# Patient Record
Sex: Female | Born: 1980 | Race: White | Marital: Married | Smoking: Current every day smoker
Health system: Southern US, Community
[De-identification: ages and names within clinical notes are randomized; demographics above are authoritative.]

## PROBLEM LIST (undated history)

## (undated) DIAGNOSIS — I10 Essential (primary) hypertension: Secondary | ICD-10-CM

## (undated) HISTORY — PX: TUBAL LIGATION: SHX77

---

## 2017-07-06 ENCOUNTER — Encounter: Payer: Self-pay | Admitting: Emergency Medicine

## 2017-07-06 ENCOUNTER — Emergency Department
Admission: EM | Admit: 2017-07-06 | Discharge: 2017-07-06 | Disposition: A | Discharge status: Home / Self Care | Payer: 59 | Attending: Emergency Medicine | Admitting: Emergency Medicine

## 2017-07-06 DIAGNOSIS — R030 Elevated blood-pressure reading, without diagnosis of hypertension: Secondary | ICD-10-CM

## 2017-07-06 DIAGNOSIS — R21 Rash and other nonspecific skin eruption: Secondary | ICD-10-CM | POA: Diagnosis present

## 2017-07-06 DIAGNOSIS — F17210 Nicotine dependence, cigarettes, uncomplicated: Secondary | ICD-10-CM | POA: Insufficient documentation

## 2017-07-06 DIAGNOSIS — I10 Essential (primary) hypertension: Secondary | ICD-10-CM | POA: Diagnosis not present

## 2017-07-06 DIAGNOSIS — L247 Irritant contact dermatitis due to plants, except food: Secondary | ICD-10-CM | POA: Diagnosis not present

## 2017-07-06 HISTORY — DX: Essential (primary) hypertension: I10

## 2017-07-06 LAB — POCT PREGNANCY, URINE: Preg Test, Ur: NEGATIVE

## 2017-07-06 MED ORDER — HYDROXYZINE HCL 25 MG PO TABS: 25.0000 mg | ORAL_TABLET | Freq: Four times a day (QID) | ORAL | 0 refills | Status: DC | PRN

## 2017-07-06 MED ORDER — TRIAMCINOLONE ACETONIDE 0.5 % EX OINT: 1.0000 "application " | TOPICAL_OINTMENT | Freq: Two times a day (BID) | CUTANEOUS | 0 refills | Status: DC

## 2017-07-06 MED ORDER — METHYLPREDNISOLONE SODIUM SUCC 125 MG IJ SOLR
80.0000 mg | Freq: Once | INTRAMUSCULAR | Status: AC
  Administered 2017-07-06: 80 mg via INTRAMUSCULAR
  Filled 2017-07-06: qty 2

## 2017-07-06 NOTE — ED Notes (Signed)
Pt ambulatory at discharge. Pt verbalized understanding of discharge instructions, follow-up care and prescriptions.

## 2017-07-06 NOTE — ED Triage Notes (Addendum)
Patient presents to the ED with rash to chest, neck and face that began on Wednesday.  Patient was seen in Urgent Care and prescribed prednisone taper for rash.  Patient states she is taking the prednisone and using calamine lotion.  Patient states at 3am this morning, "I felt like my skin was ripping off".  Patient reports some new swelling to her throat and right ear.  Patient denies any trouble breathing and is speaking without difficulty.  Patient has calamine lotion to area.  Patient states her right ear lobe is numb and she is also having difficulty hearing out of right ear.  Patient is alert and oriented x 4.

## 2017-07-06 NOTE — ED Provider Notes (Signed)
[facility] Emergency Department Provider Note  ____________________________________________   First MD Initiated Contact with Patient 07/06/17 1006     (approximate)  I have reviewed the triage vital signs and the nursing notes.   HISTORY  Chief Complaint Rash   HPI Christina Krueger is a 36 y.o. female is here with complaint of rash to her face, neck and chest. She also has places that are broken out on her forearm, back and trunk. Patient states she is new to the area and that she her husband were working trimming bushes there were several findings that they encountered. She began breaking out after this. She does not know what poison oak, poison ivy or sumac look like. She went to her urgent care and was prescribed a tapering dose of prednisone. Patient has continued to use calamine lotion without any relief. She continues to itch.   Past Medical History:  Diagnosis Date  . Hypertension     There are no active problems to display for this patient.   Past Surgical History:  Procedure Laterality Date  . TUBAL LIGATION      Prior to Admission medications   Medication Sig Start Date End Date Taking? Authorizing Provider  hydrOXYzine (ATARAX/VISTARIL) 25 MG tablet Take 1 tablet (25 mg total) by mouth every 6 (six) hours as needed for itching. 07/06/17   Tommi Rumps, PA-C  triamcinolone ointment (KENALOG) 0.5 % Apply 1 application topically 2 (two) times daily. 07/06/17   Tommi Rumps, PA-C    Allergies Patient has no known allergies.  No family history on file.  Social History Social History  Substance Use Topics  . Smoking status: Current Every Day Smoker    Packs/day: 0.25    Types: Cigarettes  . Smokeless tobacco: Never Used  . Alcohol use Yes     Comment: occasionally    Review of Systems Constitutional: No fever/chills ENT: No sore throat.  Cardiovascular: Denies chest pain. Respiratory: Denies shortness of  breath. Musculoskeletal: Negative for back pain. Skin: positive for rash Neurological: Negative for headaches,  focal weakness or numbness. ____________________________________________   PHYSICAL EXAM:  VITAL SIGNS: ED Triage Vitals  Enc Vitals Group     BP 07/06/17 0925 (!) 176/109     Pulse Rate 07/06/17 0925 90     Resp 07/06/17 0925 20     Temp 07/06/17 0925 98.1 F (36.7 C)     Temp Source 07/06/17 0925 Oral     SpO2 07/06/17 0925 98 %     Weight 07/06/17 0925 155 lb (70.3 kg)     Height 07/06/17 0925 5\' 4"  (1.626 m)     Head Circumference --      Peak Flow --      Pain Score 07/06/17 0927 7     Pain Loc --      Pain Edu? --      Excl. in GC? --    Constitutional: Alert and oriented. Well appearing and in no acute distress. Eyes: Conjunctivae are normal.  Head: Atraumatic. Nose: No congestion/rhinnorhea.  Right external ear canal and earlobe are heavily involved with vesicles some of which have opened and drained. Right TM is dull but without erythema or injection. Mouth/Throat: Mucous membranes are moist.  Oropharynx non-erythematous. Neck: No stridor.   Cardiovascular: Normal rate, regular rhythm. Grossly normal heart sounds.  Good peripheral circulation. Respiratory: Normal respiratory effort.  No retractions. Lungs CTAB. Musculoskeletal: Ms. Patton Salles and lower extremities without any difficulty. Normal gait  was noted. Neurologic:  Normal speech and language. No gross focal neurologic deficits are appreciated.  Skin:  Skin is warm, dry and intact. Erythematous, vesicular rash with some vesicles still intact and others crusting over on the right ear, anterior neck, chest and forearms. One area on the right trunk posteriorly and bilateral breast also noted. Psychiatric: Mood and affect are normal. Speech and behavior are normal.  ____________________________________________   LABS (all labs ordered are listed, but only abnormal results are displayed)  Labs Reviewed   POC URINE PREG, ED  POCT PREGNANCY, URINE   ____________________________________________  PROCEDURES  Procedure(s) performed: None  Procedures  Critical Care performed: No  ____________________________________________   INITIAL IMPRESSION / ASSESSMENT AND PLAN / ED COURSE    Patient was given a Medrol 80 mg IM in the ED. She is to continue taking her tapered dose of prednisone. Also prescription for Atarax 25 mg 4 times a day for itching and triamcinolone cream to apply to area also. She is to follow-up with dermatology if any continued problems. Blood pressure also be rechecked. Patient has history of hypertension but has not taken any medication in last 5 years as her PCP took her off of her medication.  ____________________________________________   FINAL CLINICAL IMPRESSION(S) / ED DIAGNOSES  Final diagnoses:  Irritant contact dermatitis due to plants, except food  Elevated blood pressure reading      NEW MEDICATIONS STARTED DURING THIS VISIT:  New Prescriptions   HYDROXYZINE (ATARAX/VISTARIL) 25 MG TABLET    Take 1 tablet (25 mg total) by mouth every 6 (six) hours as needed for itching.   TRIAMCINOLONE OINTMENT (KENALOG) 0.5 %    Apply 1 application topically 2 (two) times daily.     Note:  This document was prepared using Dragon voice recognition software and may include unintentional dictation errors.    Tommi RumpsSummers, Darcia Lampi L, PA-C 07/06/17 1132    Governor RooksLord, Rebecca, MD 07/06/17 [phone]

## 2017-07-06 NOTE — ED Notes (Addendum)
First Nurse Note: Pt states that she has poison oak on her face and chest. Pt states that her right ear is swollen and that she is unable to hear out of her ear or turn her head to the right side. Pt in NAD at this time.

## 2017-07-06 NOTE — Discharge Instructions (Signed)
Continue taking prednisone as directed. Begin taking Atarax 25 mg 4 times a day as needed for itching.Kenalog cream apply to area to reduce itching and irritation. Do not scratch area. Watch for any signs of infection. Follow-up with [facility] or Dr. Adolphus Birchwoodasher if any continued problems. Also [facility] to have your blood pressure rechecked as you  may need blood pressure medication in the future.  Blood pressure today was 176/109.

## 2017-07-10 ENCOUNTER — Emergency Department
Admission: EM | Admit: 2017-07-10 | Discharge: 2017-07-10 | Disposition: A | Discharge status: Home / Self Care | Payer: 59 | Attending: Emergency Medicine | Admitting: Emergency Medicine

## 2017-07-10 DIAGNOSIS — F17210 Nicotine dependence, cigarettes, uncomplicated: Secondary | ICD-10-CM | POA: Insufficient documentation

## 2017-07-10 DIAGNOSIS — I10 Essential (primary) hypertension: Secondary | ICD-10-CM | POA: Diagnosis not present

## 2017-07-10 DIAGNOSIS — L237 Allergic contact dermatitis due to plants, except food: Secondary | ICD-10-CM | POA: Insufficient documentation

## 2017-07-10 DIAGNOSIS — L299 Pruritus, unspecified: Secondary | ICD-10-CM | POA: Diagnosis present

## 2017-07-10 MED ORDER — TRIAMCINOLONE ACETONIDE 40 MG/ML IJ SUSP
40.0000 mg | Freq: Once | INTRAMUSCULAR | Status: AC
  Administered 2017-07-10: 40 mg via INTRAMUSCULAR
  Filled 2017-07-10: qty 1

## 2017-07-10 MED ORDER — TRIAMCINOLONE ACETONIDE 0.1 % EX CREA: 1.0000 "application " | TOPICAL_CREAM | Freq: Four times a day (QID) | CUTANEOUS | 2 refills | Status: DC

## 2017-07-10 NOTE — ED Triage Notes (Signed)
Patient came to Emergency room this past Sunday and was diagnosed with poison oak.  Patient given prescriptions and discharged.  Patient comes back today with spreading of rash from entire neck, anterior chest, abdomin, under breast, and left arm.

## 2017-07-10 NOTE — ED Provider Notes (Signed)
[facility] Emergency Department Provider Note  ____________________________________________  Time seen: Approximately 3:47 PM  I have reviewed the triage vital signs and the nursing notes.   HISTORY  Chief Complaint Pruritis    HPI Christina Krueger is a 36 y.o. female patient who presents the emergency department complaining of rebound rash and itching.  Patient presented to the emergency department 4 days prior and was given a shot of Solu-Medrol and discharged home with Vistaril and topical steroids.  Patient reports that today she has had worsening of pruritus as well as worsening of the visible rash.  Patient had contact with poison ivy approximately 8 days ago.  Patient reports that the itching is unbearable at this point.  Patient has had some rash on the face but denies any perioral or periocular involvement at this time.  No angioedema.  No difficulty breathing or wheezing.     Past Medical History:  Diagnosis Date  . Hypertension     There are no active problems to display for this patient.   Past Surgical History:  Procedure Laterality Date  . TUBAL LIGATION      Prior to Admission medications   Medication Sig Start Date End Date Taking? Authorizing Provider  hydrOXYzine (ATARAX/VISTARIL) 25 MG tablet Take 1 tablet (25 mg total) by mouth every 6 (six) hours as needed for itching. 07/06/17   Tommi RumpsSummers, Rhonda L, PA-C  triamcinolone cream (KENALOG) 0.1 % Apply 1 application topically 4 (four) times daily. 07/10/17   Cuthriell, Delorise RoyalsJonathan D, PA-C    Allergies Patient has no known allergies.  History reviewed. No pertinent family history.  Social History Social History  Substance Use Topics  . Smoking status: Current Every Day Smoker    Packs/day: 0.25    Types: Cigarettes  . Smokeless tobacco: Never Used  . Alcohol use Yes     Comment: occasionally     Review of Systems  Constitutional: No fever/chills Eyes: No visual changes. No  discharge ENT: No upper respiratory complaints. Cardiovascular: no chest pain. Respiratory: no cough. No SOB. Gastrointestinal: No abdominal pain.  No nausea, no vomiting.   Musculoskeletal: Negative for musculoskeletal pain. Skin: Negative for rash, abrasions, lacerations, ecchymosis.  Positive returning rash to areas of poison ivy dermatitis. Neurological: Negative for headaches, focal weakness or numbness. 10-point ROS otherwise negative.  ____________________________________________   PHYSICAL EXAM:  VITAL SIGNS: ED Triage Vitals  Enc Vitals Group     BP 07/10/17 1512 (!) 147/93     Pulse Rate 07/10/17 1512 (!) 101     Resp 07/10/17 1512 16     Temp 07/10/17 1512 98.6 F (37 C)     Temp Source 07/10/17 1512 Oral     SpO2 07/10/17 1512 100 %     Weight 07/10/17 1513 155 lb (70.3 kg)     Height 07/10/17 1513 5\' 4"  (1.626 m)     Head Circumference --      Peak Flow --      Pain Score --      Pain Loc --      Pain Edu? --      Excl. in GC? --      Constitutional: Alert and oriented. Well appearing and in no acute distress. Eyes: Conjunctivae are normal. PERRL. EOMI. Head: Atraumatic. ENT:      Ears:       Nose: No congestion/rhinnorhea.      Mouth/Throat: Mucous membranes are moist.  Neck: No stridor.   Cardiovascular: Normal rate,  regular rhythm. Normal S1 and S2.  Good peripheral circulation. Respiratory: Normal respiratory effort without tachypnea or retractions. Lungs CTAB. Good air entry to the bases with no decreased or absent breath sounds. Musculoskeletal: Full range of motion to all extremities. No gross deformities appreciated. Neurologic:  Normal speech and language. No gross focal neurologic deficits are appreciated.  Skin:  Skin is warm, dry and intact. No rash noted.  Multiple areas of erythematous, papular and macular rash.  This includes bilateral upper extremities, neck, torso.  Multiple areas of excoriation from scratching.  No signs of  infection Psychiatric: Mood and affect are normal. Speech and behavior are normal. Patient exhibits appropriate insight and judgement.   ____________________________________________   LABS (all labs ordered are listed, but only abnormal results are displayed)  Labs Reviewed - No data to display ____________________________________________  EKG   ____________________________________________  RADIOLOGY   No results found.  ____________________________________________    PROCEDURES  Procedure(s) performed:    Procedures    Medications  triamcinolone acetonide (KENALOG-40) injection 40 mg (not administered)     ____________________________________________   INITIAL IMPRESSION / ASSESSMENT AND PLAN / ED COURSE  Pertinent labs & imaging results that were available during my care of the patient were reviewed by me and considered in my medical decision making (see chart for details).  Review of the [place] CSRS was performed in accordance of the [facility] prior to dispensing any controlled drugs.     Patient's diagnosis is consistent with poison ivy dermatitis.  Patient returns the emergency department for rebound rash.  Patient was treated with injection of Solu-Medrol, patient has not had long-acting steroids and is now experiencing rebound rash.  Patient is given injection of Kenalog in the emergency department and will be discharged home with further prescription for topical triamcinolone with instructions to use calamine lotion as well as Benadryl spray.  Patient may take an oral antihistamine or Atarax for further itching relief..  She will follow with primary care as needed.  Patient is given ED precautions to return to the ED for any worsening or new symptoms.     ____________________________________________  FINAL CLINICAL IMPRESSION(S) / ED DIAGNOSES  Final diagnoses:  Poison ivy dermatitis      NEW MEDICATIONS STARTED DURING THIS VISIT:  New Prescriptions    TRIAMCINOLONE CREAM (KENALOG) 0.1 %    Apply 1 application topically 4 (four) times daily.        This chart was dictated using voice recognition software/Dragon. Despite best efforts to proofread, errors can occur which can change the meaning. Any change was purely unintentional.    Racheal Patches, PA-C 07/10/17 1557    Sharman Cheek, MD 07/10/17 [phone]

## 2017-07-10 NOTE — ED Notes (Signed)
Reddened, itching rash to body. Pt recently dx with poison oak. Pt alert and oriented X4, active, cooperative, pt in NAD. RR even and unlabored, color WNL.

## 2017-09-02 ENCOUNTER — Emergency Department (HOSPITAL_COMMUNITY)
Admission: EM | Admit: 2017-09-02 | Discharge: 2017-09-02 | Disposition: A | Discharge status: Home / Self Care | Payer: 59 | Attending: Emergency Medicine | Admitting: Emergency Medicine

## 2017-09-02 ENCOUNTER — Emergency Department (HOSPITAL_COMMUNITY): Payer: 59

## 2017-09-02 ENCOUNTER — Encounter (HOSPITAL_COMMUNITY): Payer: Self-pay | Admitting: Neurology

## 2017-09-02 ENCOUNTER — Other Ambulatory Visit: Payer: Self-pay

## 2017-09-02 DIAGNOSIS — R079 Chest pain, unspecified: Secondary | ICD-10-CM | POA: Diagnosis not present

## 2017-09-02 DIAGNOSIS — R631 Polydipsia: Secondary | ICD-10-CM | POA: Diagnosis not present

## 2017-09-02 DIAGNOSIS — R51 Headache: Secondary | ICD-10-CM | POA: Insufficient documentation

## 2017-09-02 DIAGNOSIS — F17210 Nicotine dependence, cigarettes, uncomplicated: Secondary | ICD-10-CM | POA: Diagnosis not present

## 2017-09-02 DIAGNOSIS — R682 Dry mouth, unspecified: Secondary | ICD-10-CM | POA: Diagnosis not present

## 2017-09-02 DIAGNOSIS — R0602 Shortness of breath: Secondary | ICD-10-CM | POA: Insufficient documentation

## 2017-09-02 DIAGNOSIS — I10 Essential (primary) hypertension: Secondary | ICD-10-CM | POA: Insufficient documentation

## 2017-09-02 DIAGNOSIS — R55 Syncope and collapse: Secondary | ICD-10-CM | POA: Insufficient documentation

## 2017-09-02 DIAGNOSIS — R002 Palpitations: Secondary | ICD-10-CM | POA: Insufficient documentation

## 2017-09-02 LAB — CBC
HCT: 41.5 % (ref 36.0–46.0)
Hemoglobin: 14.3 g/dL (ref 12.0–15.0)
MCH: 31.9 pg (ref 26.0–34.0)
MCHC: 34.5 g/dL (ref 30.0–36.0)
MCV: 92.6 fL (ref 78.0–100.0)
PLATELETS: 351 10*3/uL (ref 150–400)
RBC: 4.48 MIL/uL (ref 3.87–5.11)
RDW: 12.6 % (ref 11.5–15.5)
WBC: 13 10*3/uL — ABNORMAL HIGH (ref 4.0–10.5)

## 2017-09-02 LAB — BASIC METABOLIC PANEL
Anion gap: 16 — ABNORMAL HIGH (ref 5–15)
BUN: 9 mg/dL (ref 6–20)
CHLORIDE: 102 mmol/L (ref 101–111)
CO2: 20 mmol/L — ABNORMAL LOW (ref 22–32)
Calcium: 9.1 mg/dL (ref 8.9–10.3)
Creatinine, Ser: 0.94 mg/dL (ref 0.44–1.00)
GFR calc Af Amer: >60 mL/min (ref >60)
GFR calc non Af Amer: >60 mL/min (ref >60)
GLUCOSE: 127 mg/dL — AB (ref 65–99)
POTASSIUM: 3.4 mmol/L — AB (ref 3.5–5.1)
SODIUM: 138 mmol/L (ref 135–145)

## 2017-09-02 LAB — I-STAT CHEM 8, ED
BUN: 7 mg/dL (ref 6–20)
CHLORIDE: 102 mmol/L (ref 101–111)
Calcium, Ion: 1.05 mmol/L — ABNORMAL LOW (ref 1.15–1.40)
Creatinine, Ser: 0.8 mg/dL (ref 0.44–1.00)
Glucose, Bld: 121 mg/dL — ABNORMAL HIGH (ref 65–99)
HCT: 45 % (ref 36.0–46.0)
HEMOGLOBIN: 15.3 g/dL — AB (ref 12.0–15.0)
POTASSIUM: 3.4 mmol/L — AB (ref 3.5–5.1)
SODIUM: 141 mmol/L (ref 135–145)
TCO2: 22 mmol/L (ref 22–32)

## 2017-09-02 LAB — I-STAT BETA HCG BLOOD, ED (MC, WL, AP ONLY)

## 2017-09-02 LAB — CBG MONITORING, ED: Glucose-Capillary: 125 mg/dL — ABNORMAL HIGH (ref 65–99)

## 2017-09-02 LAB — I-STAT TROPONIN, ED: TROPONIN I, POC: 0.01 ng/mL (ref 0.00–0.08)

## 2017-09-02 LAB — D-DIMER, QUANTITATIVE (NOT AT ARMC)

## 2017-09-02 MED ORDER — LORAZEPAM 2 MG/ML IJ SOLN
1.0000 mg | Freq: Once | INTRAMUSCULAR | Status: AC
  Administered 2017-09-02: 1 mg via INTRAVENOUS

## 2017-09-02 MED ORDER — SODIUM CHLORIDE 0.9 % IV BOLUS (SEPSIS)
1000.0000 mL | Freq: Once | INTRAVENOUS | Status: AC
  Administered 2017-09-02: 1000 mL via INTRAVENOUS

## 2017-09-02 MED ORDER — SODIUM CHLORIDE 0.9 % IV BOLUS (SEPSIS): 1000.0000 mL | Freq: Once | INTRAVENOUS | Status: DC

## 2017-09-02 MED ORDER — LORAZEPAM 2 MG/ML IJ SOLN
INTRAMUSCULAR | Status: AC
  Administered 2017-09-02: 1 mg via INTRAVENOUS
  Filled 2017-09-02: qty 1

## 2017-09-02 NOTE — ED Notes (Signed)
Dr. Erma HeritageIsaacs reports can remove backboard. c-collar applied, spinal precautions maintained while removing backboard.

## 2017-09-02 NOTE — Progress Notes (Signed)
While rounding in ED. Per patient employer pt was at work came to him saying she was having pain in left  arm and asked himLeonette Most( [employer] boss) to give her a ride to the hospital. While standing in line at triage, she felt like her heart was skipping and beat, and she couldn't breath. She collapsed at nurse first. Was lying on the floor, was moaning, and was alert. After getting to trauma bay pt immediately started talking.  Husband Chanetta MarshallJimmy was contacted and is in route to hospital.  No direct intervention.  Supported Haematologiststaff and facilitated information sharing.  Venida JarvisWatlington, Mariangela Heldt, [place], [facility], Pager [phone]

## 2017-09-02 NOTE — ED Provider Notes (Signed)
5:29 PM Handoff from HamptonGibbons PA-C at shift change.  Patient has had ongoing palpitations and chest pain.  She reports being under a lot of stress.  She had a syncopal episode upon arrival to the emergency department.  Heart rate was found to be elevated patient was evaluated with d-dimer which was negative, cardiac labs, chest x-ray.  These are reassuring.  Heart rate improved with fluids.  Patient currently feeling well and back at her baseline.  She will need PCP follow-up, has not established care since moving to this area.  Encouraged patient to rest for the next day or 2 and hydrate well.  Return the emergency department with recurrent chest pain, shortness of breath, syncope, or other concerns.  Patient verbalizes understanding and agrees with plan.  BP (!) 148/103   Pulse 82   Temp 99.6 F (37.6 C) (Temporal)   Resp 17   Ht 5\' 4"  (1.626 m)   Wt 72.6 kg (160 lb)   LMP 08/28/2017   SpO2 100%   BMI 27.46 kg/m     Renne CriglerGeiple, Daritza Brees, PA-C 09/02/17 1730    Shaune PollackIsaacs, Cameron, MD 09/03/17 2329

## 2017-09-02 NOTE — ED Provider Notes (Signed)
[facility] EMERGENCY DEPARTMENT Provider Note   CSN: [id number] Arrival date & time: 09/02/17  1407     History   Chief Complaint Chief Complaint  Patient presents with  . Chest Pain  . Shortness of Breath    HPI Christina Krueger is a 36 y.o. female with history of hypertension and she presents to ED for evaluation of palpitations for one week. This morning, she developed worsening palpitations, chest pain, increased thirst, dry mouth, headache and pain radiating to her right upper extremity while at rest. Describes her chest pain as pressure, makes it difficult to take deep breaths. Chest pain is nonexertional and nonpleuritic, non-positional. Per triage note, while sitting inTriage patient had worsening heart palpitations stating she can breathe, she collapsed that nurse first. Bystanders noted head trauma. No eye rolling, bowel/bladder incontinence or postictal state.  Patient denies history of heart arrhythmias, DVT/PE, estrogen use. Other than the intermittent palpitations she's experienced for the last week she has been otherwise at baseline. Denies fevers, chills, vision changes, nausea, vomiting, abdominal pain, LE edema. Denies illicit drug use. Currently endorses central chest pain, dry mouth and headache.  HPI  Past Medical History:  Diagnosis Date  . Hypertension     There are no active problems to display for this patient.   Past Surgical History:  Procedure Laterality Date  . TUBAL LIGATION      OB History    No data available       Home Medications    Prior to Admission medications   Medication Sig Start Date End Date Taking? Authorizing Provider  hydrOXYzine (ATARAX/VISTARIL) 25 MG tablet Take 1 tablet (25 mg total) by mouth every 6 (six) hours as needed for itching. 07/06/17   Tommi Rumps, PA-C  triamcinolone cream (KENALOG) 0.1 % Apply 1 application topically 4 (four) times daily. 07/10/17   Cuthriell, Delorise Royals, PA-C     Family History No family history on file.  Social History Social History   Tobacco Use  . Smoking status: Current Every Day Smoker    Packs/day: 0.25    Types: Cigarettes  . Smokeless tobacco: Never Used  Substance Use Topics  . Alcohol use: Yes    Comment: occasionally  . Drug use: Not on file     Allergies   Patient has no known allergies.   Review of Systems Review of Systems  HENT:       +dry mouth  Cardiovascular: Positive for chest pain and palpitations (resolved).  Neurological: Positive for headaches.  All other systems reviewed and are negative.    Physical Exam Updated Vital Signs BP (!) 148/103   Pulse 82   Temp 99.6 F (37.6 C) (Temporal)   Resp 17   Ht 5\' 4"  (1.626 m)   Wt 72.6 kg (160 lb)   LMP 08/28/2017   SpO2 100%   BMI 27.46 kg/m   Physical Exam  Constitutional: She appears well-developed and well-nourished.  Cervical collar. In no acute distress. Alert and oriented to self, place, time and events.  HENT:  Head: Normocephalic and atraumatic.  Nose: Nose normal.  Mouth/Throat: Mucous membranes are dry.  Dry mucous members. Tonsils and oropharynx normal. No evidence of facial or scalp injury.  Eyes: Conjunctivae, EOM and lids are normal.  Neck: Trachea normal and normal range of motion. Spinous process tenderness and muscular tenderness present.  +Midline C-spine tenderness and bilateral paraspinal muscle tenderness. In cervical collar.Trachea midline  Cardiovascular: Normal rate, regular rhythm, S1 normal,  S2 normal and normal heart sounds.  Pulses:      Carotid pulses are 2+ on the right side, and 2+ on the left side.      Radial pulses are 2+ on the right side, and 2+ on the left side.       Dorsalis pedis pulses are 2+ on the right side, and 2+ on the left side.  +tachycardic 100-110. RRR. No S3. No orthopnea. No LE edema or calf tenderness. No carotid bruits.   Pulmonary/Chest: Effort normal and breath sounds normal. No  respiratory distress. She has no decreased breath sounds. She has no wheezes. She has no rhonchi. She has no rales.  No chest wall tenderness. CP not reproducible with AROM of upper extremities.   Abdominal: Soft. Bowel sounds are normal. There is no tenderness.  No epigastric tenderness  Lymphadenopathy:    She has no cervical adenopathy.  Neurological: She is alert. GCS eye subscore is 4. GCS verbal subscore is 5. GCS motor subscore is 6.  No dysarthria. No nystagmus. Strength 5/5 with hand grip and ankle flexion/extension.   Sensation to light touch intact in hands and feet. CN I and VIII not tested. CN II-XII intact bilaterally.   Skin: Skin is warm and dry. Capillary refill takes less than 2 seconds.  Psychiatric: She has a normal mood and affect. Her speech is normal and behavior is normal. Judgment and thought content normal. Cognition and memory are normal.     ED Treatments / Results  Labs (all labs ordered are listed, but only abnormal results are displayed) Labs Reviewed  CBC - Abnormal; Notable for the following components:      Result Value   WBC 13.0 (*)    All other components within normal limits  BASIC METABOLIC PANEL - Abnormal; Notable for the following components:   Potassium 3.4 (*)    CO2 20 (*)    Glucose, Bld 127 (*)    Anion gap 16 (*)    All other components within normal limits  CBG MONITORING, ED - Abnormal; Notable for the following components:   Glucose-Capillary 125 (*)    All other components within normal limits  I-STAT CHEM 8, ED - Abnormal; Notable for the following components:   Potassium 3.4 (*)    Glucose, Bld 121 (*)    Calcium, Ion 1.05 (*)    Hemoglobin 15.3 (*)    All other components within normal limits  D-DIMER, QUANTITATIVE (NOT AT [facility])  I-STAT TROPONIN, ED  I-STAT BETA HCG BLOOD, ED ([facility], [facility], [facility] ONLY)    EKG  EKG Interpretation None       Radiology Ct Head Wo Contrast  Result Date: 09/02/2017 CLINICAL DATA:  Syncope  with fall.  Headache. EXAM: CT HEAD WITHOUT CONTRAST CT CERVICAL SPINE WITHOUT CONTRAST TECHNIQUE: Multidetector CT imaging of the head and cervical spine was performed following the standard protocol without intravenous contrast. Multiplanar CT image reconstructions of the cervical spine were also generated. COMPARISON:  None. FINDINGS: CT HEAD FINDINGS Brain: No evidence of acute infarction, hemorrhage, hydrocephalus, extra-axial collection or mass lesion/mass effect. Vascular: Negative for hyperdense vessel Skull: Negative Sinuses/Orbits: Negative Other: None CT CERVICAL SPINE FINDINGS Alignment: Normal alignment.  Dextroscoliosis.  Kyphosis at C5-6. Skull base and vertebrae: Negative for fracture Soft tissues and spinal canal: Negative Disc levels:  Negative Upper chest: Negative Other: None IMPRESSION: 1. Negative CT head 2. Negative CT cervical spine. Electronically Signed   By: Marlan Palauharles  Clark M.D.   On:  09/02/2017 15:42   Ct Cervical Spine Wo Contrast  Result Date: 09/02/2017 CLINICAL DATA:  Syncope with fall.  Headache. EXAM: CT HEAD WITHOUT CONTRAST CT CERVICAL SPINE WITHOUT CONTRAST TECHNIQUE: Multidetector CT imaging of the head and cervical spine was performed following the standard protocol without intravenous contrast. Multiplanar CT image reconstructions of the cervical spine were also generated. COMPARISON:  None. FINDINGS: CT HEAD FINDINGS Brain: No evidence of acute infarction, hemorrhage, hydrocephalus, extra-axial collection or mass lesion/mass effect. Vascular: Negative for hyperdense vessel Skull: Negative Sinuses/Orbits: Negative Other: None CT CERVICAL SPINE FINDINGS Alignment: Normal alignment.  Dextroscoliosis.  Kyphosis at C5-6. Skull base and vertebrae: Negative for fracture Soft tissues and spinal canal: Negative Disc levels:  Negative Upper chest: Negative Other: None IMPRESSION: 1. Negative CT head 2. Negative CT cervical spine. Electronically Signed   By: Marlan Palauharles  Clark M.D.    On: 09/02/2017 15:42    Procedures Procedures (including critical care time)  Medications Ordered in ED Medications  sodium chloride 0.9 % bolus 1,000 mL (1,000 mLs Intravenous New Bag/Given 09/02/17 1617)  LORazepam (ATIVAN) injection 1 mg (1 mg Intravenous Given 09/02/17 1428)  sodium chloride 0.9 % bolus 1,000 mL (0 mLs Intravenous Stopped 09/02/17 1602)     Initial Impression / Assessment and Plan / ED Course  I have reviewed the triage vital signs and the nursing notes.  Pertinent labs & imaging results that were available during my care of the patient were reviewed by me and considered in my medical decision making (see chart for details).  Clinical Course as of Sep 02 1624  Tue Sep 02, 2017  1611 Reevaluated patient. She reports persistent headache and dizziness when ambulating to the bathroom. No chest pain, nausea or shortness of breath. Heart rate has normalized and is 85. Husband at bedside states she has been under a lot of stress due to holidays, work.  [CG]    Clinical Course User Index [CG] Liberty HandyGibbons, Claudia J, PA-C   36 year old female with history of hypertension presents for palpitations. Had episode of collapse while at triage in the ED, unclear if she actually lost consciousness.  On exam, she is anxious appearing. She is tachycardic in the 120s. Cardiopulmonary exam otherwise unremarkable. No abdominal pain or pulsating masses. Intact distal pulses bilaterally. She is asymptomatic with ambulation to the bathroom and reports dizziness. No previous history of seizures or on-call abuse that would raise suspicion for withdrawal. High suspicion of vasovagal and is mostly healthy patient.  Lab work today without electrolyte abnormalities, anemia. D-dimer is negative. Mild  leukocytosis but no systemic symptoms 3 suspicion for infectious etiology like pneumonia or urinary tract infection. EKG is without arrhythmias. She is given a liter IV fluids with resolution of  tachycardia. She ambulated to the bathroom and felt dizzy. We'll give an additional liter fluids and reassess.   Patient will be handed off to oncoming ED PA who will follow-up and reassess patient after IV fluids. Urinalysis pending to rule out urinary tract infection leading to leukocytosis. Anticipate discharge if workup benign with cardiology follow-up. Discussed plan with patient and husband at bedside were agreeable.  Final Clinical Impressions(s) / ED Diagnoses   Final diagnoses:  Palpitations    ED Discharge Orders    None       Liberty HandyGibbons, Claudia J, PA-C 09/02/17 1625    Shaune PollackIsaacs, Cameron, MD 09/02/17 [phone]

## 2017-09-02 NOTE — ED Triage Notes (Signed)
Pt reports today while at work developed cp, right arm pain-asked her boss to give her a ride to the hospital. While standing in line at triage, she felt like her heart was skipping and beat, and she couldn't breath. She collapsed at nurse first. Was lying on the ground, was moaning, was alert. Put on back board, head blocks.

## 2017-09-02 NOTE — ED Notes (Signed)
Pt ambulatory to the bathroom with steady gait.

## 2017-09-02 NOTE — ED Notes (Signed)
Patient transported to CT 

## 2017-09-03 ENCOUNTER — Telehealth: Payer: Self-pay | Admitting: Physician Assistant

## 2017-09-03 NOTE — Telephone Encounter (Signed)
I'm not sure why they couldn't get her in earlier. Christina Krueger has some earlier appointments.

## 2017-09-03 NOTE — Telephone Encounter (Unsigned)
Copied from CRM [phone]. Topic: Appointment Scheduling - New Patient >> Sep 03, 2017  2:24 PM Raquel SarnaHayes, Teresa G wrote: I made an app for the pt with Dr. Tinnie GensJeffrey on Dec 25th, she wanted to see her. The appt was the 1st available NP visit.  However, the pt was in the ER on Tue 12/18 because she blacked out with her BP going so high.   If there is anyway to get her in sooner it would be great for the pt.

## 2017-09-05 ENCOUNTER — Telehealth: Payer: Self-pay | Admitting: Physician Assistant

## 2017-09-05 NOTE — Telephone Encounter (Signed)
Called pt. To try and give her an earlier appt. Per CRM. The only thing earlier that Chelle has is Saturday at 3:00 and 3:20 . I left a message with her letting her know that we could get her in earlier IF we had the appt still available.  If pt calls back, please let her reschedule if appt is still available.  Thanks!

## 2017-09-10 ENCOUNTER — Ambulatory Visit: Payer: Self-pay | Admitting: Physician Assistant

## 2017-09-23 NOTE — Telephone Encounter (Signed)
Pt had an appt but was a no show

## 2017-10-23 ENCOUNTER — Encounter: Payer: Self-pay | Admitting: Adult Health

## 2017-10-23 ENCOUNTER — Encounter: Payer: Self-pay | Admitting: Family Medicine

## 2017-10-23 ENCOUNTER — Ambulatory Visit (INDEPENDENT_AMBULATORY_CARE_PROVIDER_SITE_OTHER): Payer: PRIVATE HEALTH INSURANCE | Admitting: Family Medicine

## 2017-10-23 VITALS — BP 156/115 | HR 89 | Ht 64.0 in | Wt 162.0 lb

## 2017-10-23 DIAGNOSIS — F5102 Adjustment insomnia: Secondary | ICD-10-CM | POA: Insufficient documentation

## 2017-10-23 DIAGNOSIS — F339 Major depressive disorder, recurrent, unspecified: Secondary | ICD-10-CM

## 2017-10-23 DIAGNOSIS — F411 Generalized anxiety disorder: Secondary | ICD-10-CM

## 2017-10-23 DIAGNOSIS — Z566 Other physical and mental strain related to work: Secondary | ICD-10-CM | POA: Diagnosis not present

## 2017-10-23 DIAGNOSIS — I10 Essential (primary) hypertension: Secondary | ICD-10-CM | POA: Insufficient documentation

## 2017-10-23 DIAGNOSIS — Z8659 Personal history of other mental and behavioral disorders: Secondary | ICD-10-CM | POA: Diagnosis not present

## 2017-10-23 MED ORDER — TRAZODONE HCL 50 MG PO TABS: 50.0000 mg | ORAL_TABLET | Freq: Every evening | ORAL | 0 refills | Status: DC | PRN

## 2017-10-23 MED ORDER — VENLAFAXINE HCL ER 37.5 MG PO CP24: ORAL_CAPSULE | ORAL | 0 refills | Status: DC

## 2017-10-23 MED ORDER — LOSARTAN POTASSIUM-HCTZ 100-12.5 MG PO TABS: 1.0000 | ORAL_TABLET | Freq: Every day | ORAL | 3 refills | Status: DC

## 2017-10-23 NOTE — Patient Instructions (Addendum)
Normal BP is less than 120/80.  Please make a follow-up appointment with me in 3 weeks and then secondarily make a lab only appointment about a week earlier   Behavioral Health/ Counseling Referrals    Dr. Marvene Staff, PHD Dr. Marvene Staff, PHD is a counselor in [place], [place].  [address] [place], [place] [address] Contact Information [phone]   Francee Nodal, [place]  91 [phone] [email or web address] ( she does [place] and faith-based coaching and counseling )   [employer]- ( faith-based counseling ) Address: [address] [place], [place] [address] [phone] [address] for appointments [phone] Fax Hours: Monday - Thursday 8:00am-6:00pm Closed for lunch 12-1Thursday only Friday: Closed all day   [facility] Hurley Cisco, LCSW, ACSW, M.ED.  -Hurley Cisco is a licensed clinical social worker in practice over 35 years and with Dr. Milagros Evener for the last 10 years.  -She sees adults, adolescents, children & families and couples. -Services are provided for mood and anxiety disorders, marital issues, family or parent/child problems, parenting, co-dependency, gender issues, trauma, grief, and stages of life issues. She also provides critical incident stress debriefing.  -Britta Mccreedy accepts many employee assistance programs (EAP), [employer], Chiropractor.  PHONE  [phone]                FAX [phone]   Bethann [email or web address] [employer]- gen counseling;  PHD   Corine Shelter, MSW [address] [place] [phone]   [facility] Caralyn Guile, PhD [address] [place] [phone]   [facility] Developmental and Psychological- children [address], [address] [place], [place] [phone]   Hel[facility] Beecham Professional Counselor Counseling and [employer] [phone]   [facility] abuse [facility] Manager [address], [place] [phone]   [facility] [address], [place] [phone] Eliott Nine, PhD [place] Scrape, PhD Hollace Kinnier, LCSW Andrena Mews, PhD-child, adolescent and adults   [facility] [address], [place] [phone] Daun Peacock, MS-child, adolescent and adults Madelaine Etienne, PhD-adolescent and adults   [facility]-grief, terminal illness [address], [place] [phone]   [facility] [address], [place] [phone] [employer] [address], [place] [phone]   [facility] [address] [place] [phone]   [facility] [address] [place], [place] [phone]   [facility] [address] Pura Spice [phone]   [facility] [address], [place] [phone]   [facility] [address], [place] [phone]     Please realize, EXERCISE IS MEDICINE!  -  American Heart Association [facility]) guidelines for exercise : If you are in good health, without any medical conditions, you should engage in 150 minutes of moderate intensity aerobic activity per week.  This means you should be huffing and puffing throughout your workout.   Engaging in regular exercise will improve brain function and memory, as well as improve mood, boost immune system and help with weight management.  As well as the other, more well-known effects of exercise such as decreasing blood sugar levels, decreasing blood pressure,  and decreasing bad cholesterol levels/ increasing good cholesterol levels.    Walk 30 min 5 d /wk  -  The AHA strongly  endorses consumption of a diet that contains a variety  of foods from all the food categories with an emphasis on fruits and vegetables; fat-free and low-fat dairy products; cereal and grain products; legumes and nuts; and fish, poultry, and/or extra lean meats.    Excessive food intake, especially of foods high in saturated and trans fats, sugar, and salt, should be avoided.    Adequate water intake of roughly 1/2 of your weight in pounds, should equal the ounces of water per day you should drink.  So for instance, if you're 200 pounds, that would be 100 ounces of water per day.         Mediterranean Diet  Why follow it? Research shows. . Those who follow the Mediterranean diet have a reduced risk of heart disease  . The diet is associated with a reduced incidence of Parkinson's and Alzheimer's diseases . People following the diet may have longer life expectancies and lower rates of chronic diseases  . The Dietary Guidelines for Americans recommends the Mediterranean diet as an eating plan to promote health and prevent disease  What Is the Mediterranean Diet?  . Healthy eating plan based on typical foods and recipes of Mediterranean-style cooking . The diet is primarily a plant based diet; these foods should make up a majority of meals   Starches - Plant based foods should make up a majority of meals - They are an important sources of vitamins, minerals, energy, antioxidants, and fiber - Choose whole grains, foods high in fiber and minimally processed items  - Typical grain sources include wheat, oats, barley, corn, brown rice, bulgar, farro, millet, polenta, couscous  - Various types of beans include chickpeas, lentils, fava beans, black beans, white beans   Fruits  Veggies - Large quantities of antioxidant rich fruits & veggies; 6 or more servings  - Vegetables can be eaten raw or lightly drizzled with oil and cooked  - Vegetables common to the traditional Mediterranean Diet include:  artichokes, arugula, beets, broccoli, brussel sprouts, cabbage, carrots, celery, collard greens, cucumbers, eggplant, kale, leeks, lemons, lettuce, mushrooms, okra, onions, peas, peppers, potatoes, pumpkin, radishes, rutabaga, shallots, spinach, sweet potatoes, turnips, zucchini - Fruits common to the Mediterranean Diet include: apples, apricots, avocados, cherries, clementines, dates, figs, grapefruits, grapes, melons, nectarines, oranges, peaches, pears, pomegranates, strawberries, tangerines  Fats - Replace butter and margarine with healthy oils, such as olive oil, canola oil, and tahini  - Limit nuts to no more than a handful a day  - Nuts include walnuts, almonds, pecans, pistachios, pine nuts  - Limit or avoid candied, honey roasted or heavily salted nuts - Olives are central to the [employer] - can be eaten whole or used in a variety of dishes   Meats Protein - Limiting red meat: no more than a few times a month - When eating red meat: choose lean cuts and keep the portion to the size of deck of cards - Eggs: approx. 0 to 4 times a week  - Fish and lean poultry: at least 2 a week  - Healthy protein sources include, chicken, [place], lean beef, lamb - Increase intake of seafood such as tuna, salmon, trout, mackerel, shrimp, scallops - Avoid or limit high fat processed meats such as sausage and bacon  Dairy - Include moderate amounts of low fat dairy products  - Focus on healthy dairy such as fat free yogurt, skim milk, low or reduced fat cheese - Limit dairy products higher in fat such as whole or 2% milk, cheese, ice cream  Alcohol - Moderate  amounts of red wine is ok  - No more than 5 oz daily for women (all ages) and men older than age 36  - No more than 10 oz of wine daily for men younger than 4665  Other - Limit sweets and other desserts  - Use herbs and spices instead of salt to flavor foods  - Herbs and spices common to the traditional Mediterranean Diet include: basil, bay  leaves, chives, cloves, cumin, fennel, garlic, lavender, marjoram, mint, oregano, parsley, pepper, rosemary, sage, savory, sumac, tarragon, thyme   It's not just a diet, it's a lifestyle:  . The Mediterranean diet includes lifestyle factors typical of those in the region  . Foods, drinks and meals are best eaten with others and savored . Daily physical activity is important for overall good health . This could be strenuous exercise like running and aerobics . This could also be more leisurely activities such as walking, housework, yard-work, or taking the stairs . Moderation is the key; a balanced and healthy diet accommodates most foods and drinks . Consider portion sizes and frequency of consumption of certain foods   Meal Ideas & Options:  . Breakfast:  o Whole wheat toast or whole wheat English muffins with peanut butter & hard boiled egg o Steel cut oats topped with apples & cinnamon and skim milk  o Fresh fruit: banana, strawberries, melon, berries, peaches  o Smoothies: strawberries, bananas, greek yogurt, peanut butter o Low fat greek yogurt with blueberries and granola  o Egg white omelet with spinach and mushrooms o Breakfast couscous: whole wheat couscous, apricots, skim milk, cranberries  . Sandwiches:  o Hummus and grilled vegetables (peppers, zucchini, squash) on whole wheat bread   o Grilled chicken on whole wheat pita with lettuce, tomatoes, cucumbers or tzatziki  o Tuna salad on whole wheat bread: tuna salad made with greek yogurt, olives, red peppers, capers, green onions o Garlic rosemary lamb pita: lamb sauted with garlic, rosemary, salt & pepper; add lettuce, cucumber, greek yogurt to pita - flavor with lemon juice and black pepper  . Seafood:  o Mediterranean grilled salmon, seasoned with garlic, basil, parsley, lemon juice and black pepper o Shrimp, lemon, and spinach whole-grain pasta salad made with low fat greek yogurt  o Seared scallops with lemon orzo   o Seared tuna steaks seasoned salt, pepper, coriander topped with tomato mixture of olives, tomatoes, olive oil, minced garlic, parsley, green onions and cappers  . Meats:  o Herbed greek chicken salad with kalamata olives, cucumber, feta  o Red bell peppers stuffed with spinach, bulgur, lean ground beef (or lentils) & topped with feta   o Kebabs: skewers of chicken, tomatoes, onions, zucchini, squash  o Mal[place]urkey burgers: made with red onions, mint, dill, lemon juice, feta cheese topped with roasted red peppers . Vegetarian o Cucumber salad: cucumbers, artichoke hearts, celery, red onion, feta cheese, tossed in olive oil & lemon juice  o Hummus and whole grain pita points with a greek salad (lettuce, tomato, feta, olives, cucumbers, red onion) o Lentil soup with celery, carrots made with vegetable broth, garlic, salt and pepper  o Tabouli salad: parsley, bulgur, mint, scallions, cucumbers, tomato, radishes, lemon juice, olive oil, salt and pepper.

## 2017-10-23 NOTE — Progress Notes (Signed)
New patient office visit note:  Impression and Recommendations:    1. GAD (generalized anxiety disorder)   2. Essential hypertension   3. History of depression   4. Stress at work   5. Insomnia due to stress   6. Depression, recurrent ([place])     Education and routine counseling performed. Handouts provided.  Priorities are blood pressure and stress.  1. HTN - Blood pressure goals discussed as less than 120/80.  - Information on DASH diet provided - low-sodium.  2. Stress Management & Mood - Counseling and life coaching strongly recommended to help assess the layers of her anxiety and cope with her stress issues, especially given her high pressure and emotional distress at work.  Advised that counselors can give useful strategies and coping techniques.  List of counselors provided to the patient today.  - Advised that stress at work, mood changes, all flows into all aspects of our lives.  Information on chronic stress provided, especially the negative effects on the body.  - Advised the patient to make a slow change - begin applying to other jobs that are less stressful.  - Made sure the patient understands that dealing with stress is not just medication.  Treatment of any disease involves several "spokes of the wheel."  With meds alone, the chance of improving the mood is about 10% - but adding counseling, sleep hygiene, eating habits, drinking less alcohol, exercise, prayer, meditation.  - Effexor prescribed to help begin managing her anxiety.  3. Alcohol Use - Advised that alcohol is a depressant and could be contributing to her mental stress.  Advised that we need to medicate her with something other than alcohol.  4. General Health Maintenance - Advised patient to being working toward regular exercise to improve her blood pressure, stress, and general health.  Patient advised to begin with 15 minutes of activity daily. Recommended that the patient eventually strive  for at least 30 minutes per day, 5 days a week - up to at least 150 minutes of cardio per week according to guidelines established by the [facility].   - Healthy dietary habits encouraged, including low-carb, and high amounts of lean protein in diet.   - Patient should also consume adequate amounts of water - half of body weight in oz of water per day  - For sleep, patient should start with one tablet of trazodone per night.  The importance of taking one tablet every night discussed in detail with the patient.  This medicine should also help contribute to her mood stability.  5. Follow-Up - Medications prescribed today for blood pressure, sleep, and mood.  - Reviewed the importance of starting her medications slowly, so that she can minimize side effects and slowly adjust.  - Patient will return in three weeks for follow-up OV.  Prior to this, she will come in at her convenience to obtain lab work.  Orders Placed This Encounter  Procedures  . CBC with Differential/Platelet  . Comprehensive metabolic panel  . Hemoglobin A1c  . Lipid panel  . T3, free  . T4, free  . TSH  . VITAMIN D 25 Hydroxy (Vit-D Deficiency, Fractures)    Meds ordered this encounter  Medications  . DISCONTD: losartan-hydrochlorothiazide (HYZAAR) 100-12.5 MG tablet    Sig: Take 1 tablet by mouth daily.    Dispense:  90 tablet    Refill:  3  . DISCONTD: venlafaxine XR (EFFEXOR XR) 37.5 MG 24 hr capsule  Sig: One tab by mouth daily for 5 days then switch to 2 tabs daily    Dispense:  60 capsule    Refill:  0  . DISCONTD: traZODone (DESYREL) 50 MG tablet    Sig: Take 1-2 tablets (50-100 mg total) by mouth at bedtime as needed for sleep.    Dispense:  60 tablet    Refill:  0  . DISCONTD: traZODone (DESYREL) 50 MG tablet    Sig: Take 1-2 tablets (50-100 mg total) by mouth at bedtime as needed for sleep.    Dispense:  60 tablet    Refill:  0  . DISCONTD: losartan-hydrochlorothiazide (HYZAAR) 100-12.5 MG tablet     Sig: Take 1 tablet by mouth daily.    Dispense:  90 tablet    Refill:  3  . DISCONTD: venlafaxine XR (EFFEXOR XR) 37.5 MG 24 hr capsule    Sig: One tab by mouth daily for 5 days then switch to 2 tabs daily    Dispense:  60 capsule    Refill:  0  . traZODone (DESYREL) 50 MG tablet    Sig: Take 1-2 tablets (50-100 mg total) by mouth at bedtime as needed for sleep.    Dispense:  60 tablet    Refill:  0  . losartan-hydrochlorothiazide (HYZAAR) 100-12.5 MG tablet    Sig: Take 1 tablet by mouth daily.    Dispense:  90 tablet    Refill:  3  . DISCONTD: venlafaxine XR (EFFEXOR XR) 37.5 MG 24 hr capsule    Sig: One tab by mouth daily for 5 days then switch to 2 tabs daily    Dispense:  60 capsule    Refill:  0    Gross side effects, risk and benefits, and alternatives of medications discussed with patient.  Patient is aware that all medications have potential side effects and we are unable to predict every side effect or drug-drug interaction that may occur.  Expresses verbal understanding and consents to current therapy plan and treatment regimen.  No Follow-up on file.  Please see AVS handed out to patient at the end of our visit for further patient instructions/ counseling done pertaining to today's office visit.    Note: This document was prepared using Dragon voice recognition software and may include unintentional dictation errors.     This document serves as a record of services personally performed by Mellody Dance, DO. It was created on her behalf by Toni Amend, a trained medical scribe. The creation of this record is based on the scribe's personal observations and the provider's statements to them.   I have reviewed the above medical documentation for accuracy and completeness and I concur.  Mellody Dance 11/20/17 9:10 AM    ----------------------------------------------------------------------------------------------------------------------    Subjective:     Chief complaint:   Chief Complaint  Patient presents with  . Establish Care    HPI: Christina Krueger is a pleasant 37 y.o. female who presents to [place] [facility] today to review their medical history with me and establish care.   I asked the patient to review their chronic problem list with me to ensure everything was updated and accurate.    All recent office visits with other providers, any medical records that patient brought in etc  - I reviewed today.     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  Told them to call their specialists to clarify this if they are not sure.   Patient and her family recently moved here from [place].  She was referred to the practice by a current patient.  She came in today to begin seeking help for her blood pressure; she was in the ED on 08/2017 due to her high blood pressure.  Her husband reports that this is the second time this has happened in 2 years, due to her work.  She had care years ago, and was on blood pressure medicine at that time.  However, at some point her doctor told her to stop taking it.  She was also previously on iron pills, during a time when she bruised very easily.  Social History Second marriage. Married to current husband for 10 years, together 21. Extremely stressful situation at work. 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 Her boss is an unbearable figure, "he'll walk through the office; you're scared to breathe, move, cough." They moved here 2 years ago from [place]. Moved for her job - feels trapped her to an extent and beholden to her boss. Now four hours away from their family. They can't move back right now because it's too complicated, expensive, hard, etc. Husband says she comes home and  cries at night, overwhelmed from the day.  Extremely homesick - very close with he father and misses him. Husband reports that she really doesn't like it here. She likes living in their new house - cost of living is better.  Children aged 20, 60, 19. Oldest son is from her previous marriage.  Sleeps with her 36 y/o.  Notes "she used to sleep perfect by herself," but began sleeping with with the 36 y/o and hasn't stopped.  EtOH Consumption Husband notes that they drink a lot, almost every night. On average they drink beer - Bud Light. 4-6 beers every night.  Tobacco Use Began smoking at age 8. ~18 years of smoking, at roughly 0.5-1 ppd. Smokes 6 cigarettes per day.  Family History Family history of HTN. Mother & father both have HTN. Family history of cancer - lung and colon. Great uncle on mom's side was a heavy smoker, had lung cancer in his 48's. Bradley Ferris, mom's sister, had colon cancer at age 93.  Surgical History Tubes tied.  Past Medical History  - HTN History of blood pressure problems.  Her blood pressure has always been high. Was on blood pressure medicine with a previous provider. Was told she needed water pills and medicine for her blood pressure At some point later on, her historical provider told her to stop taking the medicine.  - Stress at Work Admitted to the ED on 08/2017 due to her high blood pressure/stress at work. Husband reports that this is the second time this has happened in 2 years.  She has a fear stress response - tingling, tightness, body aches, pins and needles, severe headaches.  - Postpartum Depression After she had her oldest son, she was on depression medicine for postpartum depression. She does not remember what medication she was on.  - [employer] and turns, watches the clock and the hours are ticking by without falling asleep.  - Anemia Previously took iron pills.  Bruised very easily at that time.   Wt Readings  from Last 3 Encounters:  11/13/17 159 lb (72.1 kg)  10/23/17 162 lb (  73.5 kg)  09/02/17 160 lb (72.6 kg)   BP Readings from Last 3 Encounters:  11/13/17 135/90  10/23/17 (!) 156/115  09/02/17 (!) 142/98   Pulse Readings from Last 3 Encounters:  11/13/17 100  10/23/17 89  09/02/17 82   BMI Readings from Last 3 Encounters:  11/13/17 27.29 kg/m  10/23/17 27.81 kg/m  09/02/17 27.46 kg/m    Patient Care Team    Relationship Specialty Notifications Start End  Mellody Dance, DO PCP - General Family Medicine  11/13/17     Patient Active Problem List   Diagnosis Date Noted  . Adjustment disorder 11/13/2017  . Vitamin D deficiency 11/13/2017  . Mixed hyperlipidemia 11/13/2017  . Hypertriglyceridemia 11/13/2017  . Poor diet 11/13/2017  . Inactivity 11/13/2017  . Dysmenorrhea, unspecified 11/13/2017  . Hypertension 10/23/2017  . History of depression 10/23/2017  . Stress at work 10/23/2017  . Insomnia due to stress 10/23/2017     Past Medical History:  Diagnosis Date  . Hypertension      Past Medical History:  Diagnosis Date  . Hypertension      Past Surgical History:  Procedure Laterality Date  . TUBAL LIGATION       Family History  Problem Relation Age of Onset  . Hypertension Mother   . Hypertension Father      Social History   Substance and Sexual Activity  Drug Use No     Social History   Substance and Sexual Activity  Alcohol Use Yes  . Alcohol/week: 7.2 oz  . Types: 12 Standard drinks or equivalent per week     Social History   Tobacco Use  Smoking Status Current Every Day Smoker  . Packs/day: 0.25  . Types: Cigarettes  Smokeless Tobacco Never Used     No outpatient medications have been marked as taking for the 10/23/17 encounter (Office Visit) with Mellody Dance, DO.    Allergies: Patient has no known allergies.   Review of Systems  Constitutional: Negative for chills, diaphoresis, fever, malaise/fatigue and  weight loss.  HENT: Negative for congestion, sore throat and tinnitus.   Eyes: Negative for blurred vision, double vision and photophobia.  Respiratory: Negative for cough and wheezing.   Cardiovascular: Negative for chest pain and palpitations.  Gastrointestinal: Negative for blood in stool, diarrhea, nausea and vomiting.  Genitourinary: Negative for dysuria, frequency and urgency.  Musculoskeletal: Negative for joint pain and myalgias.  Skin: Negative for itching and rash.  Neurological: Negative for dizziness, focal weakness, weakness and headaches.  Endo/Heme/Allergies: Negative for environmental allergies and polydipsia. Does not bruise/bleed easily.  Psychiatric/Behavioral: Negative for depression and memory loss. The patient is not nervous/anxious and does not have insomnia.      Objective:   Blood pressure (!) 156/115, pulse 89, height '5\' 4"'$  (1.626 m), weight 162 lb (73.5 kg), last menstrual period 10/13/2017, SpO2 98 %. Body mass index is 27.81 kg/m. General: Well Developed, well nourished, and in no acute distress.  Neuro: Alert and oriented x3, extra-ocular muscles intact, sensation grossly intact.  HEENT:[place]/AT, PERRLA, neck supple, No carotid bruits Skin: no gross rashes  Cardiac: Regular rate and rhythm Respiratory: Essentially clear to auscultation bilaterally. Not using accessory muscles, speaking in full sentences.  Abdominal: not grossly distended Musculoskeletal: Ambulates w/o diff, FROM * 4 ext.  Vasc: less 2 sec cap RF, warm and pink  Psych:  No HI/SI, judgement and insight good, Euthymic mood. Full Affect.    Recent Results (from the past 2160 hour(s))  CBG  monitoring, ED     Status: Abnormal   Collection Time: 09/02/17  2:19 PM  Result Value Ref Range   Glucose-Capillary 125 (H) 65 - 99 mg/dL  CBC     Status: Abnormal   Collection Time: 09/02/17  2:20 PM  Result Value Ref Range   WBC 13.0 (H) 4.0 - 10.5 K/uL   RBC 4.48 3.87 - 5.11 MIL/uL   Hemoglobin  14.3 12.0 - 15.0 g/dL   HCT 41.5 36.0 - 46.0 %   MCV 92.6 78.0 - 100.0 fL   MCH 31.9 26.0 - 34.0 pg   MCHC 34.5 30.0 - 36.0 g/dL   RDW 12.6 11.5 - 15.5 %   Platelets 351 150 - 400 K/uL  Basic metabolic panel     Status: Abnormal   Collection Time: 09/02/17  2:20 PM  Result Value Ref Range   Sodium 138 135 - 145 mmol/L   Potassium 3.4 (L) 3.5 - 5.1 mmol/L   Chloride 102 101 - 111 mmol/L   CO2 20 (L) 22 - 32 mmol/L   Glucose, Bld 127 (H) 65 - 99 mg/dL   BUN 9 6 - 20 mg/dL   Creatinine, Ser 0.94 0.44 - 1.00 mg/dL   Calcium 9.1 8.9 - 10.3 mg/dL   GFR calc non Af Amer >60 >60 mL/min   GFR calc Af Amer >60 >60 mL/min    Comment: (NOTE) The eGFR has been calculated using the CKD EPI equation. This calculation has not been validated in all clinical situations. eGFR's persistently <60 mL/min signify possible Chronic Kidney Disease.    Anion gap 16 (H) 5 - 15  D-dimer, quantitative (not at [facility])     Status: None   Collection Time: 09/02/17  2:20 PM  Result Value Ref Range   D-Dimer, Quant <0.27 0.00 - 0.50 ug/mL-FEU    Comment: (NOTE) At the manufacturer cut-off of 0.50 ug/mL FEU, this assay has been documented to exclude PE with a sensitivity and negative predictive value of 97 to 99%.  At this time, this assay has not been approved by the FDA to exclude DVT/VTE. Results should be correlated with clinical presentation.   I-Stat Troponin, ED (not at [facility])     Status: None   Collection Time: 09/02/17  2:42 PM  Result Value Ref Range   Troponin i, poc 0.01 0.00 - 0.08 ng/mL   Comment 3            Comment: Due to the release kinetics of cTnI, a negative result within the first hours of the onset of symptoms does not rule out myocardial infarction with certainty. If myocardial infarction is still suspected, repeat the test at appropriate intervals.   I-Stat Beta hCG blood, ED ([facility], [facility], [facility] only)     Status: None   Collection Time: 09/02/17  2:42 PM  Result Value Ref Range   I-stat  hCG, quantitative <5.0 <5 mIU/mL   Comment 3            Comment:   GEST. AGE      CONC.  (mIU/mL)   <=1 WEEK        5 - 50     2 WEEKS       50 - 500     3 WEEKS       100 - 10,000     4 WEEKS     1,000 - 30,000        FEMALE AND NON-PREGNANT FEMALE:  LESS THAN 5 mIU/mL   I-Stat Chem 8, ED     Status: Abnormal   Collection Time: 09/02/17  2:44 PM  Result Value Ref Range   Sodium 141 135 - 145 mmol/L   Potassium 3.4 (L) 3.5 - 5.1 mmol/L   Chloride 102 101 - 111 mmol/L   BUN 7 6 - 20 mg/dL   Creatinine, Ser 0.80 0.44 - 1.00 mg/dL   Glucose, Bld 121 (H) 65 - 99 mg/dL   Calcium, Ion 1.05 (L) 1.15 - 1.40 mmol/L   TCO2 22 22 - 32 mmol/L   Hemoglobin 15.3 (H) 12.0 - 15.0 g/dL   HCT 45.0 36.0 - 46.0 %  VITAMIN D 25 Hydroxy (Vit-D Deficiency, Fractures)     Status: Abnormal   Collection Time: 11/04/17  8:20 AM  Result Value Ref Range   Vit D, 25-Hydroxy 16.4 (L) 30.0 - 100.0 ng/mL    Comment: Vitamin D deficiency has been defined by the [place] and an Endocrine Society practice guideline as a level of serum 25-OH vitamin D less than 20 ng/mL (1,2). The Endocrine Society went on to further define vitamin D insufficiency as a level between 21 and 29 ng/mL (2). 1. IOM (Institute of Medicine). 2010. Dietary reference    intakes for calcium and D. Door: The    [employer]. 2. Holick MF, Binkley Terral, Bischoff-Ferrari HA, et al.    Evaluation, treatment, and prevention of vitamin D    deficiency: an Endocrine Society clinical practice    guideline. JCEM. 2011 Jul; 96(7):1911-30.   TSH     Status: None   Collection Time: 11/04/17  8:20 AM  Result Value Ref Range   TSH 3.750 0.450 - 4.500 uIU/mL  T4, free     Status: None   Collection Time: 11/04/17  8:20 AM  Result Value Ref Range   Free T4 1.46 0.82 - 1.77 ng/dL  T3, free     Status: None   Collection Time: 11/04/17  8:20 AM  Result Value Ref Range   T3, Free 3.6 2.0 - 4.4 pg/mL  Lipid panel      Status: Abnormal   Collection Time: 11/04/17  8:20 AM  Result Value Ref Range   Cholesterol, Total 210 (H) 100 - 199 mg/dL   Triglycerides 338 (H) 0 - 149 mg/dL   HDL 62 >39 mg/dL   VLDL Cholesterol Cal 68 (H) 5 - 40 mg/dL   LDL Calculated 80 0 - 99 mg/dL   Chol/HDL Ratio 3.4 0.0 - 4.4 ratio    Comment:                                   T. Chol/HDL Ratio                                             Men  Women                               1/2 Avg.Risk  3.4    3.3                                   Avg.Risk  5.0    4.4                                2X Avg.Risk  9.6    7.1                                3X Avg.Risk 23.4   11.0   Hemoglobin A1c     Status: None   Collection Time: 11/04/17  8:20 AM  Result Value Ref Range   Hgb A1c MFr Bld 5.5 4.8 - 5.6 %    Comment:          Prediabetes: 5.7 - 6.4          Diabetes: >6.4          Glycemic control for adults with diabetes: <7.0    Est. average glucose Bld gHb Est-mCnc 111 mg/dL  Comprehensive metabolic panel     Status: Abnormal   Collection Time: 11/04/17  8:20 AM  Result Value Ref Range   Glucose 102 (H) 65 - 99 mg/dL   BUN 12 6 - 20 mg/dL   Creatinine, Ser 0.60 0.57 - 1.00 mg/dL   GFR calc non Af Amer 118 >59 mL/min/1.73   GFR calc Af Amer 137 >59 mL/min/1.73   BUN/Creatinine Ratio 20 9 - 23   Sodium 141 134 - 144 mmol/L   Potassium 4.0 3.5 - 5.2 mmol/L   Chloride 97 96 - 106 mmol/L   CO2 25 20 - 29 mmol/L   Calcium 9.5 8.7 - 10.2 mg/dL   Total Protein 7.1 6.0 - 8.5 g/dL   Albumin 4.3 3.5 - 5.5 g/dL   Globulin, Total 2.8 1.5 - 4.5 g/dL   Albumin/Globulin Ratio 1.5 1.2 - 2.2   Bilirubin Total 0.2 0.0 - 1.2 mg/dL   Alkaline Phosphatase 60 39 - 117 IU/L   AST 19 0 - 40 IU/L   ALT 20 0 - 32 IU/L  CBC with Differential/Platelet     Status: Abnormal   Collection Time: 11/04/17  8:20 AM  Result Value Ref Range   WBC 5.6 3.4 - 10.8 x10E3/uL   RBC 4.16 3.77 - 5.28 x10E6/uL   Hemoglobin 12.9 11.1 - 15.9 g/dL   Hematocrit  38.5 34.0 - 46.6 %   MCV 93 79 - 97 fL   MCH 31.0 26.6 - 33.0 pg   MCHC 33.5 31.5 - 35.7 g/dL   RDW 13.0 12.3 - 15.4 %   Platelets 318 150 - 379 x10E3/uL   Neutrophils 30 Not Estab. %   Lymphs 57 Not Estab. %   Monocytes 9 Not Estab. %   Eos 2 Not Estab. %   Basos 1 Not Estab. %   Neutrophils Absolute 1.7 1.4 - 7.0 x10E3/uL   Lymphocytes Absolute 3.3 (H) 0.7 - 3.1 x10E3/uL   Monocytes Absolute 0.5 0.1 - 0.9 x10E3/uL   EOS (ABSOLUTE) 0.1 0.0 - 0.4 x10E3/uL   Basophils Absolute 0.0 0.0 - 0.2 x10E3/uL   Immature Granulocytes 1 Not Estab. %   Immature Grans (Abs) 0.0 0.0 - 0.1 x10E3/uL

## 2017-10-28 ENCOUNTER — Ambulatory Visit: Payer: 59 | Admitting: Family Medicine

## 2017-11-04 ENCOUNTER — Other Ambulatory Visit: Payer: PRIVATE HEALTH INSURANCE

## 2017-11-04 DIAGNOSIS — F411 Generalized anxiety disorder: Secondary | ICD-10-CM

## 2017-11-04 DIAGNOSIS — F5102 Adjustment insomnia: Secondary | ICD-10-CM

## 2017-11-04 DIAGNOSIS — Z566 Other physical and mental strain related to work: Secondary | ICD-10-CM

## 2017-11-04 DIAGNOSIS — F339 Major depressive disorder, recurrent, unspecified: Secondary | ICD-10-CM

## 2017-11-04 DIAGNOSIS — I10 Essential (primary) hypertension: Secondary | ICD-10-CM

## 2017-11-05 LAB — LIPID PANEL
Chol/HDL Ratio: 3.4 ratio (ref 0.0–4.4)
Cholesterol, Total: 210 mg/dL — ABNORMAL HIGH (ref 100–199)
HDL: 62 mg/dL (ref >39)
LDL Calculated: 80 mg/dL (ref 0–99)
Triglycerides: 338 mg/dL — ABNORMAL HIGH (ref 0–149)
VLDL CHOLESTEROL CAL: 68 mg/dL — AB (ref 5–40)

## 2017-11-05 LAB — COMPREHENSIVE METABOLIC PANEL
A/G RATIO: 1.5 (ref 1.2–2.2)
ALBUMIN: 4.3 g/dL (ref 3.5–5.5)
ALT: 20 IU/L (ref 0–32)
AST: 19 IU/L (ref 0–40)
Alkaline Phosphatase: 60 IU/L (ref 39–117)
BUN / CREAT RATIO: 20 (ref 9–23)
BUN: 12 mg/dL (ref 6–20)
Bilirubin Total: 0.2 mg/dL (ref 0.0–1.2)
CALCIUM: 9.5 mg/dL (ref 8.7–10.2)
CO2: 25 mmol/L (ref 20–29)
Chloride: 97 mmol/L (ref 96–106)
Creatinine, Ser: 0.6 mg/dL (ref 0.57–1.00)
GFR, EST AFRICAN AMERICAN: 137 mL/min/{1.73_m2} (ref >59)
GFR, EST NON AFRICAN AMERICAN: 118 mL/min/{1.73_m2} (ref >59)
GLOBULIN, TOTAL: 2.8 g/dL (ref 1.5–4.5)
Glucose: 102 mg/dL — ABNORMAL HIGH (ref 65–99)
POTASSIUM: 4 mmol/L (ref 3.5–5.2)
SODIUM: 141 mmol/L (ref 134–144)
Total Protein: 7.1 g/dL (ref 6.0–8.5)

## 2017-11-05 LAB — T4, FREE: Free T4: 1.46 ng/dL (ref 0.82–1.77)

## 2017-11-05 LAB — CBC WITH DIFFERENTIAL/PLATELET
BASOS: 1 %
Basophils Absolute: 0 10*3/uL (ref 0.0–0.2)
EOS (ABSOLUTE): 0.1 10*3/uL (ref 0.0–0.4)
Eos: 2 %
HEMATOCRIT: 38.5 % (ref 34.0–46.6)
Hemoglobin: 12.9 g/dL (ref 11.1–15.9)
Immature Grans (Abs): 0 10*3/uL (ref 0.0–0.1)
Immature Granulocytes: 1 %
LYMPHS ABS: 3.3 10*3/uL — AB (ref 0.7–3.1)
Lymphs: 57 %
MCH: 31 pg (ref 26.6–33.0)
MCHC: 33.5 g/dL (ref 31.5–35.7)
MCV: 93 fL (ref 79–97)
MONOS ABS: 0.5 10*3/uL (ref 0.1–0.9)
Monocytes: 9 %
NEUTROS ABS: 1.7 10*3/uL (ref 1.4–7.0)
NEUTROS PCT: 30 %
PLATELETS: 318 10*3/uL (ref 150–379)
RBC: 4.16 x10E6/uL (ref 3.77–5.28)
RDW: 13 % (ref 12.3–15.4)
WBC: 5.6 10*3/uL (ref 3.4–10.8)

## 2017-11-05 LAB — TSH: TSH: 3.75 u[IU]/mL (ref 0.450–4.500)

## 2017-11-05 LAB — HEMOGLOBIN A1C
Est. average glucose Bld gHb Est-mCnc: 111 mg/dL
Hgb A1c MFr Bld: 5.5 % (ref 4.8–5.6)

## 2017-11-05 LAB — T3, FREE: T3, Free: 3.6 pg/mL (ref 2.0–4.4)

## 2017-11-05 LAB — VITAMIN D 25 HYDROXY (VIT D DEFICIENCY, FRACTURES): VIT D 25 HYDROXY: 16.4 ng/mL — AB (ref 30.0–100.0)

## 2017-11-13 ENCOUNTER — Encounter: Payer: Self-pay | Admitting: Family Medicine

## 2017-11-13 ENCOUNTER — Ambulatory Visit (INDEPENDENT_AMBULATORY_CARE_PROVIDER_SITE_OTHER): Payer: 59 | Admitting: Family Medicine

## 2017-11-13 ENCOUNTER — Telehealth: Payer: Self-pay | Admitting: Adult Health

## 2017-11-13 VITALS — BP 135/90 | HR 100 | Ht 64.0 in | Wt 159.0 lb

## 2017-11-13 DIAGNOSIS — E781 Pure hyperglyceridemia: Secondary | ICD-10-CM | POA: Insufficient documentation

## 2017-11-13 DIAGNOSIS — N946 Dysmenorrhea, unspecified: Secondary | ICD-10-CM | POA: Diagnosis not present

## 2017-11-13 DIAGNOSIS — E559 Vitamin D deficiency, unspecified: Secondary | ICD-10-CM | POA: Diagnosis not present

## 2017-11-13 DIAGNOSIS — Z723 Lack of physical exercise: Secondary | ICD-10-CM | POA: Diagnosis not present

## 2017-11-13 DIAGNOSIS — Z566 Other physical and mental strain related to work: Secondary | ICD-10-CM | POA: Diagnosis not present

## 2017-11-13 DIAGNOSIS — E639 Nutritional deficiency, unspecified: Secondary | ICD-10-CM | POA: Insufficient documentation

## 2017-11-13 DIAGNOSIS — F4320 Adjustment disorder, unspecified: Secondary | ICD-10-CM | POA: Diagnosis not present

## 2017-11-13 DIAGNOSIS — I10 Essential (primary) hypertension: Secondary | ICD-10-CM | POA: Diagnosis not present

## 2017-11-13 DIAGNOSIS — E782 Mixed hyperlipidemia: Secondary | ICD-10-CM | POA: Diagnosis not present

## 2017-11-13 DIAGNOSIS — Z23 Encounter for immunization: Secondary | ICD-10-CM

## 2017-11-13 DIAGNOSIS — F5102 Adjustment insomnia: Secondary | ICD-10-CM | POA: Diagnosis not present

## 2017-11-13 MED ORDER — BUPROPION HCL 75 MG PO TABS: 75.0000 mg | ORAL_TABLET | Freq: Two times a day (BID) | ORAL | 0 refills | Status: DC

## 2017-11-13 MED ORDER — VITAMIN D (ERGOCALCIFEROL) 1.25 MG (50000 UNIT) PO CAPS
ORAL_CAPSULE | ORAL | 10 refills | Status: DC
Start: 2017-11-13 — End: 2018-09-29

## 2017-11-13 NOTE — Addendum Note (Signed)
Addended by: Leda MinPULLIAM, MELISSA D on: 11/13/2017 11:55 AM   Modules accepted: Orders

## 2017-11-13 NOTE — Patient Instructions (Addendum)
Christina Krueger please give patient a note excuse for work today and tomorrow.  Can return to work on Monday.   Even though the medicines are not scored you can cut them in half and only take a half a tablet every 12 hours for couple of days to make sure you tolerate it well then go to 1 full tablet twice daily for 1 week if tolerating it well go to 2 tablets in the morning and 2 tablets in the afternoon every 12 hours until I see you in about 5-6 weeks  Really want you to check your blood pressure and pulse at home, please try to get an arm cuff monitor not wrist.  Check it occasionally may be every other day or so when you are sitting and relaxing after 15-20 minutes.     For more info on cholesterol you can go to American Heart Association website at www.heart.org   The quick and dirty--> lower triglyceride levels more through...  1) - Beware of bad fats: Cutting back on saturated fat (in red meat and full-fat dairy foods) and trans fats (in restaurant fried foods and commercially prepared baked goods) can lower triglycerides.  2) - Go for good carbs: Easily digested carbohydrates (such as white bread, white rice, cornflakes, and sugary sodas) give triglycerides a definite boost.   3) - Eating whole grains and cutting back on soda can help control triglycerides.  4) - Check your alcohol use. In some people, alcohol dramatically boosts triglycerides. The only way to know if this is true for you is to avoid alcohol for a few weeks and have your triglycerides tested again.  5) - Go fish. Omega-3 fats in salmon, tuna, sardines, and other fatty fish can lower triglycerides. Having fish twice a week is fine.  6) - Aim for a healthy weight. If you are overweight, losing just 5% to 10% of your weight can help drive down triglycerides.  7) - Get moving. Exercise lowers triglycerides and boosts heart-healthy HDL cholesterol.  8) - quit smoking if you do  --> for more information, see below; or go to   www.heart.org  and do a search for desired topics   For those diagnosed with high triglycerides, it's important to take action to lower your levels and improve your heart health.  Triglyceride is just a fancy word for fat - the fat in our bodies is stored in the form of triglycerides. Triglycerides are found in foods and manufactured in our bodies.  Normal triglyceride levels are defined as less than 150 mg/dL; 161 to 096 is considered borderline high; 200 to 499 is high; and 500 or higher is officially called very high. To me, anything over 150 is a red flag indicating my patient needs to take immediate steps to get the situation under control.   What is the significance of high triglycerides? High triglyceride levels make blood thicker and stickier, which means that it is more likely to form clots. Studies have shown that triglyceride levels are associated with increased risks of cardiovascular disease and stroke - in both men and women - alone or in combination with other risk factors (high triglycerides combined with high LDL cholesterol can be a particularly deadly combination). For example, in one ground-breaking study, high triglycerides alone increased the risk of cardiovascular disease by 14 percent in men, and by 37 percent in women. But when the test subjects also had low HDL cholesterol (that's the good cholesterol) and other risk factors, high triglycerides increased  the risk of disease by 32 percent in men and 76 percent in women.   Fortunately, triglycerides can sometimes be controlled with several diet and lifestyle changes.    What Factors Can Increase Triglycerides? As with cholesterol, eating too much of the wrong kinds of fats will raise your blood triglycerides.  Therefore, it's important to restrict the amounts of saturated fats and trans fats you allow into your diet.  Triglyceride levels can also shoot up after eating foods that are high in carbohydrates or after drinking  alcohol.  That's why triglyceride blood tests require an overnight fast.  If you have elevated triglycerides, it's especially important to avoid sugary and refined carbohydrates, including sugar, honey, and other sweeteners, soda and other sugary drinks, candy, baked goods, and anything made with white (refined or enriched) flour, including white bread, rolls, cereals, buns, pastries, regular pasta, and white rice.  You'll also want to limit dried fruit and fruit juice since they're dense in simple sugar.  All of these low-quality carbs cause a sudden rise in insulin, which may lead to a spike in triglycerides.  Triglycerides can also become elevated as a reaction to having diabetes, hypothyroidism, or kidney disease. As with most other heart-related factors, being overweight and inactive also contribute to abnormal triglycerides. And unfortunately, some people have a genetic predisposition that causes them to manufacture way too much triglycerides on their own, no matter how carefully they eat.     How Can You Lower Your Triglyceride Levels? If you are diagnosed with high triglycerides, it's important to take action. There are several things you can do to help lower your triglyceride levels and improve your heart health:  --> Lose weight if you are overweight.  There is a clear correlation between obesity and high triglycerides - the heavier people are, the higher their triglyceride levels are likely to be. The good news is that losing weight can significantly lower triglycerides. In a large study of individuals with type 2 diabetes, those assigned to the "lifestyle intervention group" - which involved counseling, a low-calorie meal plan, and customized exercise program - lost 8.6% of their body weight and lowered their triglyceride levels by more than 16%. If you're overweight, find a weight loss plan that works for you and commit to shedding the pounds and getting healthier.  --> Reduce the amount of  saturated fat and trans fat in your diet.  Start by avoiding or dramatically limiting butter, cream cheese, lard, sour cream, doughnuts, cakes, cookies, candy bars, regular ice cream, fried foods, pizza, cheese sauce, cream-based sauces and salad dressings, high-fat meats (including fatty hamburgers, bologna, pepperoni, sausage, bacon, salami, pastrami, spareribs, and hot dogs), high-fat cuts of beef and pork, and whole-milk dairy products.   Other ways to cut back: Choose lean meats only (including skinless chicken and [place], lean beef, lean pork), fish, and reduced-fat or fat-free dairy products.   Experiment with adding whole soy foods to your diet. Although soy itself may not reduce risk of heart disease, it replaces hazardous animal fats with healthier proteins. Choose high-quality soy foods, such as tofu, tempeh, soy milk, and edamame (whole soybeans).  Always remove skin from poultry.  Prepare foods by baking, roasting, broiling, boiling, poaching, steaming, grilling, or stir-frying in vegetable oil.  Most stick margarines contain trans fats, and trans fats are also found in some packaged baked goods, potato chips, snack foods, fried foods, and fast food that use or create hydrogenated oils.    (All food labels must  now list the amount of trans fats, right after the amount of saturated fats - good news for consumers. As a result, many food companies have now reformulated their products to be trans fat free.many, but not all! So it's still just as important to read labels and make sure the packaged foods you buy don't contain trans fats.)     If you use margarine, purchase soft-tub margarine spreads that contain 0 grams trans fats and don't list any partially hydrogenated oils in the ingredients list. By substituting olive oil or vegetable oil for trans fats in just 2 percent of your daily calories, you can reduce your risk of heart disease by 53 percent.   There is no safe amount of trans fats,  so try to keep them as far from your plate as possible.  -->  Avoid foods that are concentrated in sugar (even dried fruit and fruit juice). Sugary foods can elevate triglyceride levels in the blood, so keep them to a bare minimum.  --> Swap out refined carbohydrates for whole grains.  Refined carbohydrates - like white rice, regular pasta, and anything made with white or "enriched" flour (including white bread, rolls, cereals, buns, and crackers) - raise blood sugar and insulin levels more than fiber-rich whole grains. Higher insulin levels, in turn, can lead to a higher rise in triglycerides after a meal. So, make the switch to whole wheat bread, whole grain pasta, brown or wild rice, and whole grain versions of cereals, crackers, and other bread products. However, it's important to know that individuals with high triglycerides should moderate even their intake of high-quality starches (since all starches raise blood sugar) - I recommend 1 to 2 servings per meal.  --> Cut way back on alcohol.  If you have high triglycerides, alcohol should be considered a rare treat - if you indulge at all, since even small amounts of alcohol can dramatically increase triglyceride levels.  --> Incorporate omega-3 fats.  Heart-healthy fish oils are especially rich in omega-3 fatty acids. In multiple studies over the past two decades, people who ate diets high in omega-3s had 30 to 40 percent reductions in heart disease. Although we don't yet know why fish oil works so well, there are several possibilities. Omega-3s seem to reduce inflammation, reduce high blood pressure, decrease triglycerides, raise HDL cholesterol, and make blood thinner and less sticky so it is less likely to clot. It's as close to a food prescription for heart health as it gets. If you have high triglycerides, I recommend eating at least three servings of one of the omega-3-rich fish every week (fatty fish is the most concentrated food form of omega  three fats). If you cannot manage to eat that much fish, speak with your physician about taking fish oil capsules, which offer similar benefits.The best foods for omega-3 fatty acids include wild salmon (fresh, canned), herring, mackerel (not king), sardines, anchovies, rainbow trout, and Pacific oysters. Non-fish sources of omega-3 fats include omega-3-fortified eggs, ground flaxseed, chia seeds, walnuts, butternuts (white walnuts), seaweed, walnut oil, canola oil, and soybeans.  --> Quit smoking.  Smoking causes inflammation, not just in your lungs, but throughout your body. Inflammation can contribute to atherosclerosis, blood clots, and risk of heart attack. Smoking makes all heart health indicators worse. If you have high cholesterol, high triglycerides, or high blood pressure, smoking magnifies the danger.  --> Become more physically active.  Even moderate exercise can help improve cholesterol, triglycerides, and blood pressure. Aerobic exercise seems to be  able to stop the sharp rise of triglycerides after eating, perhaps because of a decrease in the amount of triglyceride released by the liver, or because active muscle clears triglycerides out of the blood stream more quickly than inactive muscle. If you haven't exercised regularly (or at all) for years, I recommend starting slowly, by walking at an easy pace for 15 minutes a day. Then, as you feel more comfortable, increase the amount. Your ultimate goal should be at least 30 minutes of moderate physical activity, at least five days a week.   Guidelines for a Low Cholesterol, Low Saturated Fat Diet   Fats - Limit total intake of fats and oils. - Avoid butter, stick margarine, shortening, lard, palm and coconut oils. - Limit mayonnaise, salad dressings, gravies and sauces, unless they are homemade with low-fat ingredients. - Limit chocolate. - Choose low-fat and nonfat products, such as low-fat mayonnaise, low-fat or non-hydrogenated peanut  butter, low-fat or fat-free salad dressings and nonfat gravy. - Use vegetable oil, such as canola or olive oil. - Look for margarine that does not contain trans fatty acids. - Use nuts in moderate amounts. - Read ingredient labels carefully to determine both amount and type of fat present in foods. Limit saturated and trans fats! - Avoid high-fat processed and convenience foods.  Meats and Meat Alternatives - Choose fish, chicken, [place] and lean meats. - Use dried beans, peas, lentils and tofu. - Limit egg yolks to three to four per week. - If you eat red meat, limit to no more than three servings per week and choose loin or round cuts. - Avoid fatty meats, such as bacon, sausage, franks, luncheon meats and ribs. - Avoid all organ meats, including liver.  Dairy - Choose nonfat or low-fat milk, yogurt and cottage cheese. - Most cheeses are high in fat. Choose cheeses made from non-fat milk, such as mozzarella and ricotta cheese. - Choose light or fat-free cream cheese and sour cream. - Avoid cream and sauces made with cream.  Fruits and Vegetables - Eat a wide variety of fruits and vegetables. - Use lemon juice, vinegar or "mist" olive oil on vegetables. - Avoid adding sauces, fat or oil to vegetables.  Breads, Cereals and Grains - Choose whole-grain breads, cereals, pastas and rice. - Avoid high-fat snack foods, such as granola, cookies, pies, pastries, doughnuts and croissants.  Cooking Tips - Avoid deep fried foods. - Trim visible fat off meats and remove skin from poultry before cooking. - Bake, broil, boil, poach or roast poultry, fish and lean meats. - Drain and discard fat that drains out of meat as you cook it. - Add little or no fat to foods. - Use vegetable oil sprays to grease pans for cooking or baking. - Steam vegetables. - Use herbs or no-oil marinades to flavor foods.
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
## 2017-11-13 NOTE — Telephone Encounter (Signed)
Upon check out patient asked me about GYN referral that Dr. Val Eagle said I could set up, I see no referral in the system, can this be addressed and if needed a referral placed for me to send

## 2017-11-17 ENCOUNTER — Telehealth: Payer: Self-pay | Admitting: Obstetrics and Gynecology

## 2017-11-17 NOTE — Telephone Encounter (Signed)
Called and left a message for patient to call back to schedule a new patient doctor referral appointment with our office to establish care and for Dysmenorrhea.

## 2017-12-08 ENCOUNTER — Telehealth: Payer: Self-pay | Admitting: Obstetrics and Gynecology

## 2017-12-08 NOTE — Telephone Encounter (Signed)
Patient called and canceled appointment. Said she would call back to reschedule.

## 2017-12-11 ENCOUNTER — Ambulatory Visit: Payer: Self-pay | Admitting: Obstetrics and Gynecology

## 2018-01-01 ENCOUNTER — Ambulatory Visit: Payer: 59 | Admitting: Family Medicine

## 2018-01-13 ENCOUNTER — Telehealth: Payer: Self-pay | Admitting: Obstetrics & Gynecology

## 2018-01-13 NOTE — Telephone Encounter (Signed)
Staff message from Mardene Sayer.:  Rinaldo Ratel,  This patient canceled her dr.referral appointment 01/14/18. Patient states she will call later to reschedule.   Thanks,Nora    Called and left a message for patient to call back to schedule a new patient doctor referral appointment with our office to establish care and for Dysmenorrhea to see Dr. Oscar La or Dr. Hyacinth Meeker.

## 2018-01-14 ENCOUNTER — Ambulatory Visit: Payer: Self-pay | Admitting: Obstetrics and Gynecology

## 2018-01-14 NOTE — Telephone Encounter (Signed)
Called and left a message for patient to call back to schedule a new patient doctor referral appointment with our office to establish care and for Dysmenorrhea to see Dr. Oscar La or Dr. Hyacinth Meeker.

## 2018-01-16 ENCOUNTER — Ambulatory Visit: Payer: 59 | Admitting: Family Medicine

## 2018-01-26 NOTE — Telephone Encounter (Signed)
Called and left a message for patient to call back to schedule a new patient doctor referral appointment with our office to establish care and for Dysmenorrhea to see Dr. Jertson or Dr. Miller. °

## 2018-01-28 NOTE — Telephone Encounter (Signed)
Routing back to referring office. Patient has not returned multiple calls to schedule an appointment with our office.

## 2018-01-29 ENCOUNTER — Encounter: Payer: Self-pay | Admitting: Family Medicine

## 2018-01-29 ENCOUNTER — Ambulatory Visit (INDEPENDENT_AMBULATORY_CARE_PROVIDER_SITE_OTHER): Payer: 59 | Admitting: Family Medicine

## 2018-01-29 VITALS — BP 137/102 | HR 82 | Ht 64.0 in | Wt 163.0 lb

## 2018-01-29 DIAGNOSIS — Z716 Tobacco abuse counseling: Secondary | ICD-10-CM | POA: Diagnosis not present

## 2018-01-29 DIAGNOSIS — Z566 Other physical and mental strain related to work: Secondary | ICD-10-CM

## 2018-01-29 DIAGNOSIS — F411 Generalized anxiety disorder: Secondary | ICD-10-CM | POA: Insufficient documentation

## 2018-01-29 DIAGNOSIS — Z723 Lack of physical exercise: Secondary | ICD-10-CM

## 2018-01-29 DIAGNOSIS — J329 Chronic sinusitis, unspecified: Secondary | ICD-10-CM

## 2018-01-29 DIAGNOSIS — F339 Major depressive disorder, recurrent, unspecified: Secondary | ICD-10-CM

## 2018-01-29 DIAGNOSIS — I10 Essential (primary) hypertension: Secondary | ICD-10-CM | POA: Diagnosis not present

## 2018-01-29 DIAGNOSIS — F4320 Adjustment disorder, unspecified: Secondary | ICD-10-CM | POA: Diagnosis not present

## 2018-01-29 DIAGNOSIS — F5102 Adjustment insomnia: Secondary | ICD-10-CM

## 2018-01-29 DIAGNOSIS — F17200 Nicotine dependence, unspecified, uncomplicated: Secondary | ICD-10-CM | POA: Diagnosis not present

## 2018-01-29 DIAGNOSIS — R05 Cough: Secondary | ICD-10-CM

## 2018-01-29 DIAGNOSIS — R059 Cough, unspecified: Secondary | ICD-10-CM

## 2018-01-29 MED ORDER — HYDROCOD POLST-CPM POLST ER 10-8 MG/5ML PO SUER: 5.0000 mL | Freq: Two times a day (BID) | ORAL | 0 refills | Status: DC | PRN

## 2018-01-29 MED ORDER — PREDNISONE 20 MG PO TABS: ORAL_TABLET | ORAL | 0 refills | Status: DC

## 2018-01-29 MED ORDER — BUPROPION HCL 75 MG PO TABS: 75.0000 mg | ORAL_TABLET | Freq: Two times a day (BID) | ORAL | 1 refills | Status: DC

## 2018-01-29 MED ORDER — LOSARTAN POTASSIUM-HCTZ 100-12.5 MG PO TABS: 1.0000 | ORAL_TABLET | Freq: Every day | ORAL | 1 refills | Status: DC

## 2018-01-29 NOTE — Patient Instructions (Signed)
You can use over-the-counter afrin nasal spray for up to 3 days (NO longer than that) which will help acutely with nasal drainage/ congestion short term.     Also, sterile saline nasal rinses, such as Neil med or AYR sinus rinses, can be very helpful and should be done twice daily- especially throughout the allergy season.   Remember you should use distilled water or previously boiled water to do this.  Then you may use over-the-counter Flonase 1 spray each nostril twice daily after sinus rinses.  You can do this in addition to taking any Allegra or Claritin or Zyrtec etc. that you may be taking daily.  If your eyes tend to get an itchy or irritated feeling when your seasonal allergies get bad, you can use Naphcon-A over-the-counter eyedrops as needed  

## 2018-01-29 NOTE — Progress Notes (Signed)
Impression and Recommendations:    1. Adjustment disorder, unspecified type   2. Essential hypertension   3. Stress at work   4. Sinusitis, unspecified chronicity, unspecified location   5. Cough-with postnasal drip   6. Insomnia due to stress   7. GAD (generalized anxiety disorder)   8. Depression, recurrent (HCC)   9. Inactivity   10. Tobacco use disorder   11. Tobacco abuse counseling     1. Adjustment disorder/stress at work/GAD/Depression -Mood is stable. Doing well on meds. Continue as listed below. Refill meds.  2. Essential HTN -Check your BP at home and keep a log. Bring this into next OV. BP should be <130s/80s.  -refill meds.  3. Sinusitis/cough -Start prednisone taper.  -start tussionex.  -supportive care discussed. Push fluids, get plenty of rest. -Take other OTC medications like tylenol or ibuprofen for pain. -use Neti pot or AYR sinus rinses, followed by 1 spray of flonase in each nostril.  -Do not take any OTC medications that may raise your blood pressure.   4. Insomnia -pt reports sleep has improved recently after taking BP and mood meds. Will only use trazodone prn, but she is currently not using this.   5. Inactivity -AHA exercise and dietary guidelines discussed. Encouraged regular exercise and moving.  6. Tobacco use disorder -Encouraged pt to stop smoking. Pt is in contemplative stage of smoking cessation.    Education and routine counseling performed. Handouts provided.  No orders of the defined types were placed in this encounter.   Meds ordered this encounter  Medications  . buPROPion (WELLBUTRIN) 75 MG tablet    Sig: Take 1 tablet (75 mg total) by mouth 2 (two) times daily.    Dispense:  180 tablet    Refill:  1  . losartan-hydrochlorothiazide (HYZAAR) 100-12.5 MG tablet    Sig: Take 1 tablet by mouth daily.    Dispense:  90 tablet    Refill:  1  . predniSONE (DELTASONE) 20 MG tablet    Sig: Take 3 pills a day for 2 days, 2  pills a day for 2 days, 1 pill a day for 2 days then one half pill a day for 2 days then off    Dispense:  14 tablet    Refill:  0  . chlorpheniramine-HYDROcodone (TUSSIONEX) 10-8 MG/5ML SUER    Sig: Take 5 mLs by mouth every 12 (twelve) hours as needed for cough (cough, will cause drowsiness.).    Dispense:  200 mL    Refill:  0    Gross side effects, risk and benefits, and alternatives of medications and treatment plan in general discussed with patient.  Patient is aware that all medications have potential side effects and we are unable to predict every side effect or drug-drug interaction that may occur.   Patient will call with any questions prior to using medication if they have concerns.  Expresses verbal understanding and consents to current therapy and treatment regimen.  No barriers to understanding were identified.  Red flag symptoms and signs discussed in detail.  Patient expressed understanding regarding what to do in case of emergency\urgent symptoms  Please see AVS handed out to patient at the end of our visit for further patient instructions/ counseling done pertaining to today's office visit.   Return in about 4 months (around 06/01/2018).     Note: This document was prepared using Dragon voice recognition software and may include unintentional dictation errors.  This document serves as  a record of services personally performed by Thomasene Lot, DO. It was created on her behalf by Thelma Barge, a trained medical scribe. The creation of this record is based on the scribe's personal observations and the provider's statements to them.   I have reviewed the above medical documentation for accuracy and completeness and I concur.  Thomasene Lot 01/29/18 9:31  AM  --------------------------------------------------------------------------------------------------------------------------------------------------------------------------------------------------------------------------------------------    Subjective:    CC:  Chief Complaint  Patient presents with  . Follow-up    HPI: Christina Krueger is a 37 y.o. female who presents to [facility] today for follow-up of mood and HTN.   HTN HPI:  -  Her blood pressure has been controlled at home.  Pt is checking it at home.   - Patient reports good compliance with blood pressure medications. She is taking wellbutrin and losartan-hctz.   124-130s/80s at home.  - Denies medication S-E   - Smoking Status noted. She is still smoking.   - She denies new onset of: chest pain, exercise intolerance, shortness of breath, dizziness, visual changes, headache, lower extremity swelling or claudication.   Last 3 blood pressure readings in our office are as follows: BP Readings from Last 3 Encounters:  01/29/18 (!) 137/102  11/13/17 135/90  10/23/17 (!) 156/115    Filed Weights   01/29/18 0807  Weight: 163 lb (73.9 kg)   Mood She feels good. Per her husband she is not as teary after work. She is less anxious afterwards.  She is following up after starting wellbutrin 6 weeks ago. She tried and failed effexor.  Sleep After being on wellbutrin and losartan, she has not needed to use her trazodone for sleep.    Acutely She complains of rapid onset, gradually worsening sinus pain that began 2-3 days ago but worsened today. She has associated "leaky eyes", sinus pressure, HA, jaw pain and dental pain bilaterally, sore throat, cough (worse at night), post-nasal drip, wheezing, SOB, and "feeling like someone is sitting on my chest". Per her husband, she has a positive sick contact in her daughter who also had a recent cold.   She has tried sudafed and mucinex with mild to  no relief.   She denies fevers and one-sided face pain.   Depression screen [facility] 2/9 01/29/2018 11/13/2017 10/23/2017  Decreased Interest 0 0 0  Down, Depressed, Hopeless 0 0 0  PHQ - 2 Score 0 0 0  Altered sleeping 0 0 1  Tired, decreased energy 0 0 0  Change in appetite 0 0 0  Feeling bad or failure about yourself  0 0 0  Trouble concentrating 0 0 0  Moving slowly or fidgety/restless 0 0 0  Suicidal thoughts 0 0 0  PHQ-9 Score 0 0 1  Difficult doing work/chores Not difficult at all Not difficult at all Not difficult at all     GAD 7 : Generalized Anxiety Score 11/13/2017  Nervous, Anxious, on Edge 1  Control/stop worrying 0  Worry too much - different things 0  Trouble relaxing 0  Restless 0  Easily annoyed or irritable 0  Afraid - awful might happen 0  Total GAD 7 Score 1  Anxiety Difficulty Not difficult at all     Wt Readings from Last 3 Encounters:  01/29/18 163 lb (73.9 kg)  11/13/17 159 lb (72.1 kg)  10/23/17 162 lb (73.5 kg)   BP Readings from Last 3 Encounters:  01/29/18 (!) 137/102  11/13/17 135/90  10/23/17 Marland Kitchen)  156/115   Pulse Readings from Last 3 Encounters:  01/29/18 82  11/13/17 100  10/23/17 89   BMI Readings from Last 3 Encounters:  01/29/18 27.98 kg/m  11/13/17 27.29 kg/m  10/23/17 27.81 kg/m    Patient Care Team    Relationship Specialty Notifications Start End  Thomasene Lot, DO PCP - General Family Medicine  11/13/17      Patient Active Problem List   Diagnosis Date Noted  . GAD (generalized anxiety disorder) 01/29/2018  . Tobacco use disorder 01/29/2018  . Tobacco abuse counseling 01/29/2018  . Depression, recurrent (HCC) 01/29/2018  . Adjustment disorder 11/13/2017  . Vitamin D deficiency 11/13/2017  . Mixed hyperlipidemia 11/13/2017  . Hypertriglyceridemia 11/13/2017  . Poor diet 11/13/2017  . Inactivity 11/13/2017  . Dysmenorrhea, unspecified 11/13/2017  . Hypertension 10/23/2017  . History of depression 10/23/2017  .  Stress at work 10/23/2017  . Insomnia due to stress 10/23/2017    Past Medical history, Surgical history, Family history, Social history, Allergies and Medications have been entered into the medical record, reviewed and changed as needed.    No outpatient medications have been marked as taking for the 01/29/18 encounter (Office Visit) with Thomasene Lot, DO.    Allergies:  No Known Allergies   Review of Systems: Review of Systems: General:   No F/C, wt loss Pulm:   No DIB, pleuritic chest pain Card:  No CP, palpitations Abd:  No n/v/d or pain Ext:  No inc edema from baseline Psych: no SI/ HI    Objective:   Blood pressure (!) 137/102, pulse 82, height  (1.626 m), weight 163 lb (73.9 kg), last menstrual period 01/10/2018, SpO2 99 %. Body mass index is 27.98 kg/m. General:  Well Developed, well nourished, appropriate for stated age.  Neuro:  Alert and oriented,  extra-ocular muscles intact  HEENT:  Normocephalic, atraumatic, neck supple, no carotid bruits appreciated. TM's WNL bilaterally, no TTP sinuses, nares- edematous and erythematous with clear drainage. OP- erythema without petechiae or banding. No exudate. No LAD that is TTP.  Skin:  no gross rash, warm, pink. Cardiac:  RRR, S1 S2 Respiratory:  ECTA B/L and A/P no R/R/Wh, Not using accessory muscles, speaking in full sentences- unlabored. Vascular:  Ext warm, no cyanosis apprec.; cap RF less 2 sec. Psych:  No HI/SI, judgement and insight good, Euthymic mood. Full Affect.

## 2018-02-03 ENCOUNTER — Other Ambulatory Visit: Payer: Self-pay

## 2018-02-03 ENCOUNTER — Telehealth: Payer: Self-pay | Admitting: Family Medicine

## 2018-02-03 DIAGNOSIS — J069 Acute upper respiratory infection, unspecified: Secondary | ICD-10-CM

## 2018-02-03 MED ORDER — AMOXICILLIN-POT CLAVULANATE 875-125 MG PO TABS: 1.0000 | ORAL_TABLET | Freq: Two times a day (BID) | ORAL | 0 refills | Status: DC

## 2018-02-03 NOTE — Telephone Encounter (Signed)
Sent in Augmentin per Dr. Synthia Innocent instructions. MPulliam, CMA/RT(R)

## 2018-02-03 NOTE — Telephone Encounter (Signed)
Patient called into the office to see if she can get antibiotic called into the pharmacy.  She was seen last week and doesn't feel she is any better.  Still having pressure and pain around and above her eyes.

## 2018-02-03 NOTE — Telephone Encounter (Signed)
Spoke to the patient, she states that symptoms have gotten worse the last 2 days.  She is having pain and pressure on the left side of her face and now has a productive cough that is producing colored phlegm. Please advise. MPulliam, CMA/RT(R)

## 2018-02-03 NOTE — Telephone Encounter (Signed)
Sent in medication per Dr. Synthia Innocent instructions. Patient notifed. MPulliam, CMA/RT(R)

## 2018-06-09 ENCOUNTER — Telehealth: Payer: Self-pay | Admitting: Family Medicine

## 2018-06-09 NOTE — Telephone Encounter (Signed)
Patient contacted pharm about BP med recall and was told it was not on the recall list but was on informed that it was back in April so because of this she wants to be put on an alternative BP med. Please advise.

## 2018-06-09 NOTE — Telephone Encounter (Signed)
Please see note in chart - sent to Dr. Sharee Holsterpalski. MPulliam, CMA/RT(R)

## 2018-06-09 NOTE — Telephone Encounter (Signed)
Patient was told to follow-up around 06/01/2018 or sooner if blood pressure was not at goal of less than 130/80 on a regular basis.  She needs a follow-up office visit and at that time we can discuss changing medicines however, please call patient and let her know that it either is or is not on the recall list, it is not a question- it is black and white since the FDA has listed exact lot numbers that were affected.    She needs to clarify this with her pharmacy to see if her exact lot number that she was given was actually affected or not.

## 2018-06-09 NOTE — Telephone Encounter (Signed)
Spoke to patient and she states that she has contacted the pharmacy and was informed that her BP meds were not affected by the recent recall but may have been during the recall in April.  Patient would like to change BP meds.  Patient states BP has been running 135-142/90s and she is having headaches, denies any other symptoms.  Please review and advise. Patient was last seen on 01/29/18. MPulliam, CMA/RT(R)

## 2018-06-09 NOTE — Telephone Encounter (Signed)
Patient called states she saw that manufacture was Recalling Lorsartan  BP meds & she wants to be switch to a diff BP med.  --Forwarding request to medical assistant to review w/provider.  --glh

## 2018-06-10 ENCOUNTER — Telehealth: Payer: Self-pay | Admitting: Family Medicine

## 2018-06-10 NOTE — Telephone Encounter (Signed)
Spoke to the patient, she states that she is unable to come in for a follow up in the next month and a half due to work.  Patient states that pharmacy informed her that her medication is not on current recall but that her medication was on recall in April.  Patient is concerned with taking the medication due to not being aware of the recall in April and is worried that it will happen again without her knowing.  Advised the patient that follow up visit are important to mange medications and treatment plans in order to provide the best care for the patient.  Also advised the patient to fax, send through MyChart her BP log to help Dr. Sharee Holster assess.  MPulliam, CMA/RT(R)

## 2018-06-10 NOTE — Telephone Encounter (Signed)
Patient wanted to know if decision made about Losartan switch--- Forwarding message to medical asst to contact pt with information. -glh

## 2018-06-11 ENCOUNTER — Encounter: Payer: Self-pay | Admitting: Family Medicine

## 2018-06-12 ENCOUNTER — Other Ambulatory Visit: Payer: Self-pay | Admitting: Family Medicine

## 2018-06-12 DIAGNOSIS — I10 Essential (primary) hypertension: Secondary | ICD-10-CM

## 2018-06-12 MED ORDER — OLMESARTAN-AMLODIPINE-HCTZ 20-5-12.5 MG PO TABS: 1.0000 | ORAL_TABLET | Freq: Every day | ORAL | 0 refills | Status: DC

## 2018-06-12 NOTE — Telephone Encounter (Signed)
New medicine sent into pharmacy on file.  Tell patient to check blood pressure and keep a log at home.  If blood pressures are not 130/80 or less on a regular basis, patient should follow-up sooner than planned.  Patient will absolutely need a office visit with [place] prior to any refills being given.  She was only given a 90-day supply and will not be dispensed anymore until we evaluate how the treatment plan is working for her.

## 2018-06-12 NOTE — Telephone Encounter (Signed)
Patient notified. MPulliam, CMA/RT(R)  

## 2018-06-14 ENCOUNTER — Emergency Department
Admission: EM | Admit: 2018-06-14 | Discharge: 2018-06-14 | Disposition: A | Discharge status: Home / Self Care | Payer: 59 | Attending: Emergency Medicine | Admitting: Emergency Medicine

## 2018-06-14 ENCOUNTER — Emergency Department: Payer: 59

## 2018-06-14 ENCOUNTER — Encounter: Payer: Self-pay | Admitting: Family Medicine

## 2018-06-14 ENCOUNTER — Encounter: Payer: Self-pay | Admitting: Emergency Medicine

## 2018-06-14 ENCOUNTER — Other Ambulatory Visit: Payer: Self-pay

## 2018-06-14 DIAGNOSIS — R079 Chest pain, unspecified: Secondary | ICD-10-CM | POA: Insufficient documentation

## 2018-06-14 DIAGNOSIS — Z5321 Procedure and treatment not carried out due to patient leaving prior to being seen by health care provider: Secondary | ICD-10-CM | POA: Insufficient documentation

## 2018-06-14 LAB — CBC
HCT: 38.5 % (ref 35.0–47.0)
Hemoglobin: 13.7 g/dL (ref 12.0–16.0)
MCH: 32.9 pg (ref 26.0–34.0)
MCHC: 35.5 g/dL (ref 32.0–36.0)
MCV: 92.7 fL (ref 80.0–100.0)
PLATELETS: 336 10*3/uL (ref 150–440)
RBC: 4.15 MIL/uL (ref 3.80–5.20)
RDW: 12.9 % (ref 11.5–14.5)
WBC: 9.5 10*3/uL (ref 3.6–11.0)

## 2018-06-14 LAB — BASIC METABOLIC PANEL
Anion gap: 13 (ref 5–15)
BUN: 10 mg/dL (ref 6–20)
CHLORIDE: 101 mmol/L (ref 98–111)
CO2: 23 mmol/L (ref 22–32)
CREATININE: 0.67 mg/dL (ref 0.44–1.00)
Calcium: 8.8 mg/dL — ABNORMAL LOW (ref 8.9–10.3)
Glucose, Bld: 116 mg/dL — ABNORMAL HIGH (ref 70–99)
Potassium: 3.6 mmol/L (ref 3.5–5.1)
SODIUM: 137 mmol/L (ref 135–145)

## 2018-06-14 LAB — TROPONIN I: Troponin I: <0.03 ng/mL (ref <0.03)

## 2018-06-14 NOTE — ED Notes (Signed)
Call without answer.

## 2018-06-14 NOTE — ED Notes (Signed)
Called to room without answer.  

## 2018-06-14 NOTE — ED Notes (Signed)
Called pt to room without answer.

## 2018-06-14 NOTE — ED Triage Notes (Addendum)
Patient reports left sided chest pain that started last night. Patient also states she feels like she can't get a deep breath. Patient reports history of HTN but denies any other cardiac history. Also complaining of numbness and tingling to both hands.

## 2018-09-08 LAB — HEPATIC FUNCTION PANEL
ALK PHOS: 59 (ref 25–125)
ALT: 83 — AB (ref 7–35)
AST: 70 — AB (ref 13–35)
BILIRUBIN, TOTAL: 0.3

## 2018-09-08 LAB — CBC AND DIFFERENTIAL
HCT: 38 (ref 36–46)
HEMOGLOBIN: 13.3 (ref 12.0–16.0)
NEUTROS ABS: 4431
PLATELETS: 387 (ref 150–399)
WBC: 8.1

## 2018-09-08 LAB — VITAMIN D 25 HYDROXY (VIT D DEFICIENCY, FRACTURES): Vit D, 25-Hydroxy: 22

## 2018-09-08 LAB — BASIC METABOLIC PANEL
Creatinine: 0.7 (ref 0.5–1.1)
Potassium: 4.2 (ref 3.4–5.3)
Sodium: 140 (ref 137–147)

## 2018-09-08 LAB — TSH: TSH: 3.58 (ref 0.41–5.90)

## 2018-09-08 LAB — POCT ERYTHROCYTE SEDIMENTATION RATE, NON-AUTOMATED: Sed Rate: 14

## 2018-09-29 ENCOUNTER — Encounter: Payer: Self-pay | Admitting: Family Medicine

## 2018-09-29 ENCOUNTER — Ambulatory Visit (INDEPENDENT_AMBULATORY_CARE_PROVIDER_SITE_OTHER): Payer: PRIVATE HEALTH INSURANCE | Admitting: Family Medicine

## 2018-09-29 VITALS — BP 135/89 | HR 93 | Temp 98.5°F | Ht 64.0 in | Wt 158.4 lb

## 2018-09-29 DIAGNOSIS — M25561 Pain in right knee: Secondary | ICD-10-CM

## 2018-09-29 DIAGNOSIS — R768 Other specified abnormal immunological findings in serum: Secondary | ICD-10-CM | POA: Diagnosis not present

## 2018-09-29 DIAGNOSIS — F1010 Alcohol abuse, uncomplicated: Secondary | ICD-10-CM

## 2018-09-29 DIAGNOSIS — M1990 Unspecified osteoarthritis, unspecified site: Secondary | ICD-10-CM | POA: Diagnosis not present

## 2018-09-29 DIAGNOSIS — E559 Vitamin D deficiency, unspecified: Secondary | ICD-10-CM

## 2018-09-29 DIAGNOSIS — M109 Gout, unspecified: Secondary | ICD-10-CM | POA: Diagnosis not present

## 2018-09-29 DIAGNOSIS — M25562 Pain in left knee: Secondary | ICD-10-CM

## 2018-09-29 DIAGNOSIS — R748 Abnormal levels of other serum enzymes: Secondary | ICD-10-CM

## 2018-09-29 MED ORDER — COLCHICINE 0.6 MG PO TABS: 0.6000 mg | ORAL_TABLET | Freq: Two times a day (BID) | ORAL | 0 refills | Status: DC

## 2018-09-29 MED ORDER — VITAMIN D (ERGOCALCIFEROL) 1.25 MG (50000 UNIT) PO CAPS: ORAL_CAPSULE | ORAL | 3 refills | Status: DC

## 2018-09-29 MED ORDER — INDOMETHACIN 50 MG PO CAPS: 50.0000 mg | ORAL_CAPSULE | Freq: Three times a day (TID) | ORAL | 0 refills | Status: DC | PRN

## 2018-09-29 NOTE — Patient Instructions (Addendum)
Gout  Gout is a condition that causes painful swelling of the joints. Gout is a type of inflammation of the joints (arthritis). This condition is caused by having too much uric acid in the body. Uric acid is a chemical that forms when the body breaks down substances called purines. Purines are important for building body proteins. When the body has too much uric acid, sharp crystals can form and build up inside the joints. This causes pain and swelling. Gout attacks can happen quickly and may be very painful (acute gout). Over time, the attacks can affect more joints and become more frequent (chronic gout). Gout can also cause uric acid to build up under the skin and inside the kidneys. What are the causes? This condition is caused by too much uric acid in your blood. This can happen because:  Your kidneys do not remove enough uric acid from your blood. This is the most common cause.  Your body makes too much uric acid. This can happen with some cancers and cancer treatments. It can also occur if your body is breaking down too many red blood cells (hemolytic anemia).  You eat too many foods that are high in purines. These foods include organ meats and some seafood. Alcohol, especially beer, is also high in purines. A gout attack may be triggered by trauma or stress. What increases the risk? You are more likely to develop this condition if you:  Have a family history of gout.  Are female and middle-aged.  Are female and have gone through menopause.  Are obese.  Frequently drink alcohol, especially beer.  Are dehydrated.  Lose weight too quickly.  Have an organ transplant.  Have lead poisoning.  Take certain medicines, including aspirin, cyclosporine, diuretics, levodopa, and niacin.  Have kidney disease.  Have a skin condition called psoriasis. What are the signs or symptoms? An attack of acute gout happens quickly. It usually occurs in just one joint. The most common place  is the big toe. Attacks often start at night. Other joints that may be affected include joints of the feet, ankle, knee, fingers, wrist, or elbow. Symptoms of this condition may include:  Severe pain.  Warmth.  Swelling.  Stiffness.  Tenderness. The affected joint may be very painful to touch.  Shiny, red, or purple skin.  Chills and fever. Chronic gout may cause symptoms more frequently. More joints may be involved. You may also have white or yellow lumps (tophi) on your hands or feet or in other areas near your joints. How is this diagnosed? This condition is diagnosed based on your symptoms, medical history, and physical exam. You may have tests, such as:  Blood tests to measure uric acid levels.  Removal of joint fluid with a thin needle (aspiration) to look for uric acid crystals.  X-rays to look for joint damage. How is this treated? Treatment for this condition has two phases: treating an acute attack and preventing future attacks. Acute gout treatment may include medicines to reduce pain and swelling, including:  NSAIDs.  Steroids. These are strong anti-inflammatory medicines that can be taken by mouth (orally) or injected into a joint.  Colchicine. This medicine relieves pain and swelling when it is taken soon after an attack. It can be given by mouth or through an IV. Preventive treatment may include:  Daily use of smaller doses of NSAIDs or colchicine.  Use of a medicine that reduces uric acid levels in your blood.  Changes to your diet.  You may need to see a dietitian about what to eat and drink to prevent gout. Follow these instructions at home: During a gout attack   If directed, put ice on the affected area: ? Put ice in a plastic bag. ? Place a towel between your skin and the bag. ? Leave the ice on for 20 minutes, 2-3 times a day.  Raise (elevate) the affected joint above the level of your heart as often as possible.  Rest the joint as much as  possible. If the affected joint is in your leg, you may be given crutches to use.  Follow instructions from your health care provider about eating or drinking restrictions. Avoiding future gout attacks  Follow a low-purine diet as told by your dietitian or health care provider. Avoid foods and drinks that are high in purines, including liver, kidney, anchovies, asparagus, herring, mushrooms, mussels, and beer.  Maintain a healthy weight or lose weight if you are overweight. If you want to lose weight, talk with your health care provider. It is important that you do not lose weight too quickly.  Start or maintain an exercise program as told by your health care provider. Inflammatory arthro Eating and drinking  Drink enough fluids to keep your urine pale yellow.  If you drink alcohol: ? Limit how much you use to:  0-1 drink a day for women.  0-2 drinks a day for men. ? Be aware of how much alcohol is in your drink. In the [place] one drink equals one 12 oz bottle of beer (355 mL) one 5 oz glass of wine (148 mL), or one 1 oz glass of hard liquor (44 mL). General instructions  Take over-the-counter and prescription medicines only as told by your health care provider.  Do not drive or use heavy machinery while taking prescription pain medicine.  Return to your normal activities as told by your health care provider. Ask your health care provider what activities are safe for you.  Keep all follow-up visits as told by your health care provider. This is important. Contact a health care provider if you have:  Another gout attack.  Continuing symptoms of a gout attack after 10 days of treatment.  Side effects from your medicines.  Chills or a fever.  Burning pain when you urinate.  Pain in your lower back or belly. Get help right away if you:  Have severe or uncontrolled pain.  Cannot urinate. Summary  Gout is painful swelling of the joints caused by inflammation.  The most  common site of pain is the big toe, but it can affect other joints in the body.  Medicines and dietary changes can help to prevent and treat gout attacks. This information is not intended to replace advice given to you by your health care provider. Make sure you discuss any questions you have with your health care provider. Document Released: 08/30/2000 Document Revised: 03/25/2018 Document Reviewed: 03/25/2018 Elsevier Interactive Patient Education  2019 [employer]     Low-Purine Eating Plan  A low-purine eating plan involves making food choices to limit your intake of purine. Purine is a kind of uric acid. Too much uric acid in your blood can cause certain conditions, such as gout and kidney stones. Eating a low-purine diet can help control these conditions. What are tips for following this plan? Reading food labels   Avoid foods with saturated or Trans fat.  Check the ingredient list of grains-based foods, such as bread and cereal, to make  sure that they contain whole grains.  Check the ingredient list of sauces or soups to make sure they do not contain meat or fish.  When choosing soft drinks, check the ingredient list to make sure they do not contain high-fructose corn syrup. Shopping  Buy plenty of fresh fruits and vegetables.  Avoid buying canned or fresh fish.  Buy dairy products labeled as low-fat or nonfat.  Avoid buying premade or processed foods. These foods are often high in fat, salt (sodium), and added sugar. Cooking  Use olive oil instead of butter when cooking. Oils like olive oil, canola oil, and sunflower oil contain healthy fats. Meal planning  Learn which foods do or do not affect you. If you find out that a food tends to cause your gout symptoms to flare up, avoid eating that food. You can enjoy foods that do not cause problems. If you have any questions about a food item, talk with your dietitian or health care provider.  Limit foods high in fat,  especially saturated fat. Fat makes it harder for your body to get rid of uric acid.  Choose foods that are lower in fat and are lean sources of protein. General guidelines  Limit alcohol intake to no more than 1 drink a day for nonpregnant women and 2 drinks a day for men. One drink equals 12 oz of beer, 5 oz of wine, or 1 oz of hard liquor. Alcohol can affect the way your body gets rid of uric acid.  Drink plenty of water to keep your urine clear or pale yellow. Fluids can help remove uric acid from your body.  If directed by your health care provider, take a vitamin C supplement.  Work with your health care provider and dietitian to develop a plan to achieve or maintain a healthy weight. Losing weight can help reduce uric acid in your blood. What foods are recommended? The items listed may not be a complete list. Talk with your dietitian about what dietary choices are best for you. Foods low in purines Foods low in purines do not need to be limited. These include:  All fruits.  All low-purine vegetables, pickles, and olives.  Breads, pasta, rice, cornbread, and popcorn. Cake and other baked goods.  All dairy foods.  Eggs, nuts, and nut butters.  Spices and condiments, such as salt, herbs, and vinegar.  Plant oils, butter, and margarine.  Water, sugar-free soft drinks, tea, coffee, and cocoa.  Vegetable-based soups, broths, sauces, and gravies. Foods moderate in purines Foods moderate in purines should be limited to the amounts listed.   cup of asparagus, cauliflower, spinach, mushrooms, or green peas, each day.  2/3 cup uncooked oatmeal, each day.   cup dry wheat bran or wheat germ, each day.  2-3 ounces of meat or poultry, each day.  4-6 ounces of shellfish, such as crab, lobster, oysters, or shrimp, each day.  1 cup cooked beans, peas, or lentils, each day.  Soup, broths, or bouillon made from meat or fish. Limit these foods as much as possible. What foods  are not recommended? The items listed may not be a complete list. Talk with your dietitian about what dietary choices are best for you. Limit your intake of foods high in purines, including:  Beer and other alcohol.  Meat-based gravy or sauce.  Canned or fresh fish, such as: ? Anchovies, sardines, herring, and tuna. ? Mussels and scallops. ? Codfish, trout, and haddock.  Tomasa BlaseBacon.  Organ meats, such as: ?  Liver or kidney. ? Tripe. ? Sweetbreads (thymus gland or pancreas).  Wild Education officer, environmental.  Yeast or yeast extract supplements.  Drinks sweetened with high-fructose corn syrup. Summary  Eating a low-purine diet can help control conditions caused by too much uric acid in the body, such as gout or kidney stones.  Choose low-purine foods, limit alcohol, and limit foods high in fat.  You will learn over time which foods do or do not affect you. If you find out that a food tends to cause your gout symptoms to flare up, avoid eating that food. This information is not intended to replace advice given to you by your health care provider. Make sure you discuss any questions you have with your health care provider. Document Released: 12/28/2010 Document Revised: 10/16/2016 Document Reviewed: 10/16/2016 Elsevier Interactive Patient Education  2019 [employer].

## 2018-09-29 NOTE — Progress Notes (Signed)
Impression and Recommendations:    1. Inflammatory arthropathy   2. Elevated antinuclear antibody (ANA) level   3. Pain in both knees, unspecified chronicity   4. Uric acid arthropathy   5. Excessive drinking alcohol   6. Elevated liver enzymes   7. Vitamin D deficiency     - Need for re-check ANA, Uric Acid, CMP, and Vitamin D next visit.  1. Specialty Follow-Up - Regarding pt's recent specialty visit; reviewed in great detail recent lab results, clinical lab tests, and obtained history from family member.  -Independent visualization of lab results done by me today.   - Discussed need for patient to continue to obtain management and screenings with specialists as recommended.  Educated patient at length about the critical importance of keeping health maintenance up to date and monitored by appropriate parties.  - Participated in lengthy conversation and all questions were answered.  2. Recent Evidence of Inflammatory Arthropathy - Elevated ANA level - Recently seen by PA at [facility] where labs were drawn.  - Results of recent lab work were reviewed and discussed with patient at length.  Educated patient that her ANA's returned positive, and these positive lab results may be indicative of autoimmune arthropathy.  - Lifestyle changes STRONGLY RECOMMENDED and discussed at length today.  Referral to rheumatology recommended if lifestyle changes do not improve patient's inflammatory markers/immune response.  - Extensive education provided.  3. Elevated Uric Acid in the Blood - Knees Flaring, Gout Highly Suspected - Extensively discussed that patient's lifestyle is the likeliest cause of her lab results & symptoms. - Advised patient to begin low purine diet to help decrease uric acid in the blood.  - Handout for low-purine diet provided today.  - Colcrys prescribed to help decrease uric acid levels in the blood. - Reviewed risks and benefits of beginning Colcrys,  including possibility of diarrhea.  - Allopurinol may be added in the future as a daily preventative once patient's acute symptoms are under control.  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  - Low Purine Diet discussed at length and agreed-upon. - For two months, patient agrees to steer clear from high purine diet, and do everything she can from a lifestyle standpoint to reduce uric acid.  Patient will hydrate, avoid all foods that exacerbate gout, and see if her labs and symptoms improve.    - Patient agrees to reduce alcohol and be compliant with a low purine diet.  - Re-check labs in 2-3 months.  If nothing improves with reduced purine lifestyle, patient agrees to follow up with rheumatology as advised.  4. Elevated Liver Enzymes & High Alcohol Consumption - Every 2-3 days, reduce the amount of drinks per night by one.  - Discussed that patient should aim to cut out all alcohol consumption.  5. Vitamin D Deficiency - 22 - Discussed that low Vitamin D may cause aching bones and muscles and cause patient to feel fatigued.  - Take one tablet on Wednesday, one tablet on Sunday.  - Re-check in 2 months and return for follow-up.  6. BMI Counseling - BMI of 27.19 Explained to patient what BMI refers to, and what it means medically.    Told patient to think about it as a "medical risk stratification measurement" and how increasing BMI is associated with increasing risk/ or worsening state of various diseases such as hypertension, hyperlipidemia, diabetes, premature OA, depression etc.  American Heart Association guidelines for healthy diet, basically Mediterranean diet,  and exercise guidelines of 30 minutes 5 days per week or more discussed in detail.  Health counseling performed.  All questions answered.  7. Lifestyle & Preventative Health Maintenance - Advised patient to continue working toward exercising to improve overall mental, physical, and emotional health.     - Reviewed the "spokes of the wheel" of mood and health management.  Stressed the importance of ongoing prudent habits, including regular exercise, appropriate sleep hygiene, healthful dietary habits, and prayer/meditation to relax.  - Encouraged patient to engage in daily physical activity, especially a formal exercise routine.  Recommended that the patient eventually strive for at least 150 minutes of moderate cardiovascular activity per week according to guidelines established by the [facility].   - Healthy dietary habits encouraged, including low-carb, and high amounts of lean protein in diet.   - Patient should also consume adequate amounts of water.  Pt was interviewed and evaluated by me in the clinic today for 32.5+ minutes, with over 50% time spent in face to face counseling of patients various medical conditions, treatment plans of those medical conditions including medicine management and lifestyle modification, strategies to improve health and well being; and in coordination of care. SEE ABOVE TREATMENT PLAN FOR DETAILS   Meds ordered this encounter  Medications  . colchicine 0.6 MG tablet    Sig: Take 1 tablet (0.6 mg total) by mouth 2 (two) times daily. Start within 24hrs of gouty flair; take until jt pain improves or Diarhea    Dispense:  60 tablet    Refill:  0  . indomethacin (INDOCIN) 50 MG capsule    Sig: Take 1 capsule (50 mg total) by mouth 3 (three) times daily as needed for moderate pain (with meal).    Dispense:  90 capsule    Refill:  0  . Vitamin D, Ergocalciferol, (DRISDOL) 1.25 MG (50000 UT) CAPS capsule    Sig: Take 1 capsule Wednesdays and Sundays.    Dispense:  24 capsule    Refill:  3    Medications Discontinued During This Encounter  Medication Reason  . amoxicillin-clavulanate (AUGMENTIN) 875-125 MG tablet Completed Course  . chlorpheniramine-HYDROcodone (TUSSIONEX) 10-8 MG/5ML SUER Completed Course  . predniSONE (DELTASONE) 20 MG tablet Completed Course   . Vitamin D, Ergocalciferol, (DRISDOL) 50000 units CAPS capsule Reorder     Gross side effects, risk and benefits, and alternatives of medications and treatment plan in general discussed with patient.  Patient is aware that all medications have potential side effects and we are unable to predict every side effect or drug-drug interaction that may occur.   Patient will call with any questions prior to using medication if they have concerns.    Expresses verbal understanding and consents to current therapy and treatment regimen.  No barriers to understanding were identified.  Red flag symptoms and signs discussed in detail.  Patient expressed understanding regarding what to do in case of emergency\urgent symptoms  Please see AVS handed out to patient at the end of our visit for further patient instructions/ counseling done pertaining to today's office visit.   Return for 2) 9mo OV with me - will need bldwrk 3 d prior.     Note:  This note was prepared with assistance of Dragon voice recognition software. Occasional wrong-word or sound-a-like substitutions may have occurred due to the inherent limitations of voice recognition software.   This document serves as a record of services personally performed by Thomasene Loteborah Bronco Mcgrory, DO. It was created on her behalf by  Peggye Fothergill, a trained medical scribe. The creation of this record is based on the scribe's personal observations and the provider's statements to them.   I have reviewed the above medical documentation for accuracy and completeness and I concur.  Thomasene Lot, DO 09/29/2018 12:20 PM    \    --------------------------------------------------------------------------------------------------------------------------------    Subjective:     HPI: Christina Krueger is a 38 y.o. female who presents to [facility] today for issues as discussed below.  Follow-Up with Orthopedics Patient recently  seen by Orthopedics, where she had labs drawn.  Follows up at [facility].  PA Andrez Grime ordered the labs.  She works with Dr. Payton Emerald.  Vitamin D Patient has not been taking her Vitamin D.  Elevated Purine Content in Blood, Knees Flaring - History Consistent with Gout She sometimes experiences red-hot swollen knee joints in the evening at home.  This has been going on for months, but worsened the last couple of weeks, since Christmas.  Patient notes that she drinks about two beers every night, and several shots of liquor.  Patient and her husband say they eat steak and shrimp all the time.    States that she often eats tomatoes and husband states she drinks "four gallons of lemonade per week."  Elevated Alcohol Consumption Patient thinks she may have trouble reducing her alcohol intake per night.  She is currently consuming 2-3 beers and 4-5 shots of liquor per night.   Wt Readings from Last 3 Encounters:  09/29/18 158 lb 6.4 oz (71.8 kg)  06/14/18 160 lb (72.6 kg)  01/29/18 163 lb (73.9 kg)   BP Readings from Last 3 Encounters:  09/29/18 135/89  06/14/18 (!) 167/117  01/29/18 (!) 137/102   Pulse Readings from Last 3 Encounters:  09/29/18 93  06/14/18 94  01/29/18 82   BMI Readings from Last 3 Encounters:  09/29/18 27.19 kg/m  06/14/18 27.46 kg/m  01/29/18 27.98 kg/m     Patient Care Team    Relationship Specialty Notifications Start End  Thomasene Lot, DO PCP - General Family Medicine  11/13/17      Patient Active Problem List   Diagnosis Date Noted  . 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 Elevated antinuclear antibody (ANA) level 09/29/2018  . Pain in both knees 09/29/2018  . Elevated liver enzymes 09/29/2018  . Excessive drinking alcohol 09/29/2018  . 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 GAD (generalized anxiety disorder) 01/29/2018  . Tobacco use disorder 01/29/2018  . Tobacco abuse counseling 01/29/2018  . Depression, recurrent (HCC)  01/29/2018  . Adjustment disorder 11/13/2017  . Vitamin D deficiency 11/13/2017  . Mixed hyperlipidemia 11/13/2017  . Hypertriglyceridemia 11/13/2017  . Poor diet 11/13/2017  . Inactivity 11/13/2017  . Dysmenorrhea, unspecified 11/13/2017  . Hypertension 10/23/2017  . History of depression 10/23/2017  . Stress at work 10/23/2017  . Insomnia due to stress 10/23/2017    Past Medical history, Surgical history, Family history, Social history, Allergies and Medications have been entered into the medical record, reviewed and changed as needed.    Current Meds  Medication Sig  . buPROPion (WELLBUTRIN) 75 MG tablet Take 1 tablet (75 mg total) by mouth 2 (two) times daily.  . Olmesartan-amLODIPine-HCTZ 20-5-12.5 MG TABS Take 1 tablet by mouth daily. Patient will need OV before any refills    Allergies:  No Known Allergies   Review of Systems:  A fourteen system review of systems was performed and found to be positive as  per HPI.   Objective:   Blood pressure 135/89, pulse 93, temperature 98.5 F (36.9 C), height 5\' 4"  (1.626 m), weight 158 lb 6.4 oz (71.8 kg), SpO2 100 %. Body mass index is 27.19 kg/m. General:  Well Developed, well nourished, appropriate for stated age.  Neuro:  Alert and oriented,  extra-ocular muscles intact  HEENT:  Normocephalic, atraumatic, neck supple, no carotid bruits appreciated  Skin:  no gross rash, warm, pink. Cardiac:  RRR, S1 S2 Respiratory:  ECTA B/L and A/P, Not using accessory muscles, speaking in full sentences- unlabored. Vascular:  Ext warm, no cyanosis apprec.; cap RF less 2 sec. Psych:  No HI/SI, judgement and insight good, Euthymic mood. Full Affect.

## 2018-10-03 ENCOUNTER — Other Ambulatory Visit: Payer: Self-pay | Admitting: Family Medicine

## 2018-10-03 DIAGNOSIS — I10 Essential (primary) hypertension: Secondary | ICD-10-CM

## 2018-11-24 ENCOUNTER — Encounter: Payer: Self-pay | Admitting: Family Medicine

## 2018-11-24 ENCOUNTER — Encounter: Payer: Self-pay | Admitting: Adult Health

## 2018-11-24 ENCOUNTER — Ambulatory Visit (INDEPENDENT_AMBULATORY_CARE_PROVIDER_SITE_OTHER): Payer: PRIVATE HEALTH INSURANCE | Admitting: Adult Health

## 2018-11-24 VITALS — BP 151/91 | HR 94 | Temp 98.9°F | Ht 64.0 in | Wt 158.2 lb

## 2018-11-24 DIAGNOSIS — R059 Cough, unspecified: Secondary | ICD-10-CM

## 2018-11-24 DIAGNOSIS — R05 Cough: Secondary | ICD-10-CM

## 2018-11-24 DIAGNOSIS — J111 Influenza due to unidentified influenza virus with other respiratory manifestations: Secondary | ICD-10-CM | POA: Insufficient documentation

## 2018-11-24 MED ORDER — HYDROCOD POLST-CPM POLST ER 10-8 MG/5ML PO SUER: 5.0000 mL | Freq: Two times a day (BID) | ORAL | 0 refills | Status: DC | PRN

## 2018-11-24 MED ORDER — OSELTAMIVIR PHOSPHATE 75 MG PO CAPS: 75.0000 mg | ORAL_CAPSULE | Freq: Two times a day (BID) | ORAL | 0 refills | Status: DC

## 2018-11-24 NOTE — Patient Instructions (Signed)
Influenza, Adult Influenza is also called "the flu." It is an infection in the lungs, nose, and throat (respiratory tract). It is caused by a virus. The flu causes symptoms that are similar to symptoms of a cold. It also causes a high fever and body aches. The flu spreads easily from person to person (is contagious). Getting a flu shot (influenza vaccination) every year is the best way to prevent the flu. What are the causes? This condition is caused by the influenza virus. You can get the virus by:  Breathing in droplets that are in the air from the cough or sneeze of a person who has the virus.  Touching something that has the virus on it (is contaminated) and then touching your mouth, nose, or eyes. What increases the risk? Certain things may make you more likely to get the flu. These include:  Not washing your hands often.  Having close contact with many people during cold and flu season.  Touching your mouth, eyes, or nose without first washing your hands.  Not getting a flu shot every year. You may have a higher risk for the flu, along with serious problems such as a lung infection (pneumonia), if you:  Are older than 65.  Are pregnant.  Have a weakened disease-fighting system (immune system) because of a disease or taking certain medicines.  Have a long-term (chronic) illness, such as: ? Heart, kidney, or lung disease. ? Diabetes. ? Asthma.  Have a liver disorder.  Are very overweight (morbidly obese).  Have anemia. This is a condition that affects your red blood cells. What are the signs or symptoms? Symptoms usually begin suddenly and last 4-14 days. They may include:  Fever and chills.  Headaches, body aches, or muscle aches.  Sore throat.  Cough.  Runny or stuffy (congested) nose.  Chest discomfort.  Not wanting to eat as much as normal (poor appetite).  Weakness or feeling tired (fatigue).  Dizziness.  Feeling sick to your stomach (nauseous) or  throwing up (vomiting). How is this treated? If the flu is found early, you can be treated with medicine that can help reduce how bad the illness is and how long it lasts (antiviral medicine). This may be given by mouth (orally) or through an IV tube. Taking care of yourself at home can help your symptoms get better. Your doctor may suggest:  Taking over-the-counter medicines.  Drinking plenty of fluids. The flu often goes away on its own. If you have very bad symptoms or other problems, you may be treated in a hospital. Follow these instructions at home:     Activity  Rest as needed. Get plenty of sleep.  Stay home from work or school as told by your doctor. ? Do not leave home until you do not have a fever for 24 hours without taking medicine. ? Leave home only to visit your doctor. Eating and drinking  Take an ORS (oral rehydration solution). This is a drink that is sold at pharmacies and stores.  Drink enough fluid to keep your pee (urine) pale yellow.  Drink clear fluids in small amounts as you are able. Clear fluids include: ? Water. ? Ice chips. ? Fruit juice that has water added (diluted fruit juice). ? Low-calorie sports drinks.  Eat bland, easy-to-digest foods in small amounts as you are able. These foods include: ? Bananas. ? Applesauce. ? Rice. ? Lean meats. ? Toast. ? Crackers.  Do not eat or drink: ? Fluids that have a lot   of sugar or caffeine. ? Alcohol. ? Spicy or fatty foods. General instructions  Take over-the-counter and prescription medicines only as told by your doctor.  Use a cool mist humidifier to add moisture to the air in your home. This can make it easier for you to breathe.  Cover your mouth and nose when you cough or sneeze.  Wash your hands with soap and water often, especially after you cough or sneeze. If you cannot use soap and water, use alcohol-based hand sanitizer.  Keep all follow-up visits as told by your doctor. This is  important. How is this prevented?   Get a flu shot every year. You may get the flu shot in late summer, fall, or winter. Ask your doctor when you should get your flu shot.  Avoid contact with people who are sick during fall and winter (cold and flu season). Contact a doctor if:  You get new symptoms.  You have: ? Chest pain. ? Watery poop (diarrhea). ? A fever.  Your cough gets worse.  You start to have more mucus.  You feel sick to your stomach.  You throw up. Get help right away if you:  Have shortness of breath.  Have trouble breathing.  Have skin or nails that turn a bluish color.  Have very bad pain or stiffness in your neck.  Get a sudden headache.  Get sudden pain in your face or ear.  Cannot eat or drink without throwing up. Summary  Influenza ("the flu") is an infection in the lungs, nose, and throat. It is caused by a virus.  Take over-the-counter and prescription medicines only as told by your doctor.  Getting a flu shot every year is the best way to avoid getting the flu. This information is not intended to replace advice given to you by your health care provider. Make sure you discuss any questions you have with your health care provider. Document Released: 06/11/2008 Document Revised: 02/18/2018 Document Reviewed: 02/18/2018 Elsevier Interactive Patient Education  2019 [employer].   We are unable to test for flu due to out of test strips, however your symptoms are so consistent with the flu and you have known exposure. Please take Tamiflu are directed and Tussionex as needed. Increase fluids and rest. Alternate OTC Acetaminophen and Ibuprofen as needed for fever and discomfort. Take this opportunity to reduce to stop tobacco use- YOU CAN DO IT! Work Excuse provided, okay to return 11/30/2018. FEEL BETTER!

## 2018-11-24 NOTE — Progress Notes (Signed)
Subjective:    Patient ID: Christina Krueger, female    DOB: 1980/12/30, 38 y.o.   MRN: [id number]  HPI:  Ms. Richarson presents with extreme fatigue, low grade fever (highest 159f), body aches, chills, strong, constant non-productive cough, nausea with vomiting, diarrhea, chest tightness, weakness, poor appetite, and clear nasal drainage that all abruptly started yesterday afternoon. She has been exposed to influenza at work- several co-workers with Influenza B+  She smokes 6 cigarettes/day- she is trying to quit- great! She reports one of her children had Influenza B last month She denies CP/dizziness/palpitations  Last CMP 09/08/18 GFR 111 Patient Care Team    Relationship Specialty Notifications Start End  Thomasene Lot, DO PCP - General Family Medicine  11/13/17     Patient Active Problem List   Diagnosis Date Noted  . Influenza 11/24/2018  . Cough in adult patient 11/24/2018  . 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 Elevated antinuclear antibody (ANA) level 09/29/2018  . Pain in both knees 09/29/2018  . Elevated liver enzymes 09/29/2018  . Excessive drinking alcohol 09/29/2018  . 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 GAD (generalized anxiety disorder) 01/29/2018  . Tobacco use disorder 01/29/2018  . Tobacco abuse counseling 01/29/2018  . Depression, recurrent (HCC) 01/29/2018  . Adjustment disorder 11/13/2017  . Vitamin D deficiency 11/13/2017  . Mixed hyperlipidemia 11/13/2017  . Hypertriglyceridemia 11/13/2017  . Poor diet 11/13/2017  . Inactivity 11/13/2017  . Dysmenorrhea, unspecified 11/13/2017  . Hypertension 10/23/2017  . History of depression 10/23/2017  . Stress at work 10/23/2017  . Insomnia due to stress 10/23/2017     Past Medical History:  Diagnosis Date  . Hypertension      Past Surgical History:  Procedure Laterality Date  . TUBAL LIGATION       Family History  Problem Relation Age of Onset  . Hypertension Mother   . Hypertension Father       Social History   Substance and Sexual Activity  Drug Use No     Social History   Substance and Sexual Activity  Alcohol Use Yes  . Alcohol/week: 12.0 standard drinks  . Types: 12 Standard drinks or equivalent per week     Social History   Tobacco Use  Smoking Status Current Every Day Smoker  . Packs/day: 0.25  . Types: Cigarettes  Smokeless Tobacco Never Used     Outpatient Encounter Medications as of 11/24/2018  Medication Sig  . buPROPion (WELLBUTRIN) 75 MG tablet Take 1 tablet (75 mg total) by mouth 2 (two) times daily.  . colchicine 0.6 MG tablet Take 1 tablet (0.6 mg total) by mouth 2 (two) times daily. Start within 24hrs of gouty flair; take until jt pain improves or Diarhea  . indomethacin (INDOCIN) 50 MG capsule Take 1 capsule (50 mg total) by mouth 3 (three) times daily as needed for moderate pain (with meal).  . Olmesartan-amLODIPine-HCTZ 20-5-12.5 MG TABS TAKE 1 TABLET BY MOUTH ONCE DAILY. PATIENT WILL NEED OFFICE VISIT BEFORE ANY REFILLS.  Marland Kitchen Vitamin D, Ergocalciferol, (DRISDOL) 1.25 MG (50000 UT) CAPS capsule Take 1 capsule Wednesdays and Sundays.  . chlorpheniramine-HYDROcodone (TUSSIONEX) 10-8 MG/5ML SUER Take 5 mLs by mouth every 12 (twelve) hours as needed.  Marland Kitchen oseltamivir (TAMIFLU) 75 MG capsule Take 1 capsule (75 mg total) by mouth 2 (two) times daily.   No facility-administered encounter medications on file as of 11/24/2018.     Allergies: Patient has no known allergies.  Body mass index is 27.15 kg/m.  Blood pressure Marland Kitchen)  151/91, pulse 94, temperature 98.9 F (37.2 C), temperature source Oral, height 5\' 4"  (1.626 m), weight 158 lb 3.2 oz (71.8 kg), last menstrual period 11/02/2018, SpO2 99 %.  Review of Systems  Constitutional: Positive for activity change, appetite change, chills, diaphoresis, fatigue and fever. Negative for unexpected weight change.  HENT: Positive for congestion, postnasal drip, rhinorrhea, sinus pressure and sinus pain.  Negative for ear discharge, ear pain, sore throat, trouble swallowing and voice change.   Eyes: Negative for visual disturbance.  Respiratory: Positive for cough and chest tightness. Negative for shortness of breath, wheezing and stridor.   Cardiovascular: Negative for chest pain, palpitations and leg swelling.  Gastrointestinal: Positive for diarrhea and nausea. Negative for abdominal distention, abdominal pain, blood in stool, constipation and vomiting.  Genitourinary: Negative for difficulty urinating and flank pain.  Skin: Negative for color change, pallor, rash and wound.  Neurological: Negative for dizziness and headaches.  Hematological: Does not bruise/bleed easily.  Psychiatric/Behavioral: Positive for sleep disturbance.       Objective:   Physical Exam Vitals signs and nursing note reviewed.  Constitutional:      Appearance: She is ill-appearing and diaphoretic.  HENT:     Head: Normocephalic and atraumatic.     Right Ear: Ear canal and external ear normal. No decreased hearing noted. There is no impacted cerumen. Tympanic membrane is bulging. Tympanic membrane is not erythematous.     Left Ear: Ear canal and external ear normal. No decreased hearing noted. There is no impacted cerumen. Tympanic membrane is bulging. Tympanic membrane is not erythematous.     Nose: Congestion present.     Right Turbinates: Swollen.     Left Turbinates: Swollen.     Right Sinus: No maxillary sinus tenderness or frontal sinus tenderness.     Left Sinus: No maxillary sinus tenderness or frontal sinus tenderness.     Mouth/Throat:     Mouth: Mucous membranes are dry.     Pharynx: Posterior oropharyngeal erythema present. No oropharyngeal exudate.     Tonsils: No tonsillar exudate. Swelling: 1+ on the right. 1+ on the left.  Eyes:     Extraocular Movements: Extraocular movements intact.     Conjunctiva/sclera: Conjunctivae normal.     Pupils: Pupils are equal, round, and reactive to light.   Neck:     Musculoskeletal: Normal range of motion.  Cardiovascular:     Rate and Rhythm: Normal rate.     Pulses: Normal pulses.     Heart sounds: Normal heart sounds. No murmur. No friction rub. No gallop.   Pulmonary:     Effort: Pulmonary effort is normal. No respiratory distress.     Breath sounds: Normal breath sounds. No stridor, decreased air movement or transmitted upper airway sounds. No decreased breath sounds, wheezing, rhonchi or rales.  Chest:     Chest wall: No tenderness.  Lymphadenopathy:     Cervical: No cervical adenopathy.  Skin:    General: Skin is warm.     Capillary Refill: Capillary refill takes less than 2 seconds.  Neurological:     Mental Status: She is alert and oriented to person, place, and time.  Psychiatric:        Mood and Affect: Mood normal.        Behavior: Behavior normal.        Thought Content: Thought content normal.        Judgment: Judgment normal.       Assessment & Plan:   1. Influenza  2. Cough in adult patient     Influenza We are unable to test for flu due to out of test strips, however your symptoms are so consistent with the flu and you have known exposure. Please take Tamiflu are directed and Tussionex as needed. Increase fluids and rest. Alternate OTC Acetaminophen and Ibuprofen as needed for fever and discomfort. Take this opportunity to reduce to stop tobacco use- YOU CAN DO IT! Work Excuse provided, okay to return 11/30/2018.  Cough in adult patient [place] Controlled Substance Database reviewed- no aberrancies noted Cough worsens in evening Tussionex as needed Smoking 6 cigarettes/day Reduce to stop tobacco use- you can do it!    FOLLOW-UP:  Return if symptoms worsen or fail to improve.

## 2018-11-24 NOTE — Assessment & Plan Note (Signed)
[place] Controlled Substance Database reviewed- no aberrancies noted Cough worsens in evening Tussionex as needed Smoking 6 cigarettes/day Reduce to stop tobacco use- you can do it!

## 2018-11-24 NOTE — Assessment & Plan Note (Signed)
We are unable to test for flu due to out of test strips, however your symptoms are so consistent with the flu and you have known exposure. Please take Tamiflu are directed and Tussionex as needed. Increase fluids and rest. Alternate OTC Acetaminophen and Ibuprofen as needed for fever and discomfort. Take this opportunity to reduce to stop tobacco use- YOU CAN DO IT! Work Excuse provided, okay to return 11/30/2018.

## 2018-12-08 ENCOUNTER — Other Ambulatory Visit: Payer: PRIVATE HEALTH INSURANCE

## 2018-12-10 ENCOUNTER — Ambulatory Visit: Payer: PRIVATE HEALTH INSURANCE | Admitting: Family Medicine

## 2019-11-15 ENCOUNTER — Other Ambulatory Visit: Payer: Self-pay

## 2019-11-15 ENCOUNTER — Ambulatory Visit (INDEPENDENT_AMBULATORY_CARE_PROVIDER_SITE_OTHER): Payer: PRIVATE HEALTH INSURANCE | Admitting: Family Medicine

## 2019-11-15 ENCOUNTER — Encounter: Payer: Self-pay | Admitting: Family Medicine

## 2019-11-15 VITALS — BP 138/92 | Temp 97.2°F | Ht 64.0 in | Wt 150.0 lb

## 2019-11-15 DIAGNOSIS — J3089 Other allergic rhinitis: Secondary | ICD-10-CM | POA: Insufficient documentation

## 2019-11-15 DIAGNOSIS — J324 Chronic pansinusitis: Secondary | ICD-10-CM | POA: Diagnosis not present

## 2019-11-15 MED ORDER — PREDNISONE 20 MG PO TABS: ORAL_TABLET | ORAL | 0 refills | Status: DC

## 2019-11-15 MED ORDER — AZELASTINE HCL 0.1 % NA SOLN: 1.0000 | Freq: Two times a day (BID) | NASAL | 2 refills | Status: DC

## 2019-11-15 MED ORDER — FLUTICASONE PROPIONATE 50 MCG/ACT NA SUSP: NASAL | 2 refills | Status: DC

## 2019-11-15 MED ORDER — LEVOCETIRIZINE DIHYDROCHLORIDE 5 MG PO TABS: 5.0000 mg | ORAL_TABLET | Freq: Every evening | ORAL | 0 refills | Status: DC

## 2019-11-15 NOTE — Patient Instructions (Signed)
Allergies, Adult °An allergy is when your body's defense system (immune system) overreacts to an otherwise harmless substance (allergen) that you breathe in or eat or something that touches your skin. When you come into contact with something that you are allergic to, your immune system produces certain proteins (antibodies). These proteins cause cells to release chemicals (histamines) that trigger the symptoms of an allergic reaction. °Allergies often affect the nasal passages (allergic rhinitis), eyes (allergic conjunctivitis), skin (atopic dermatitis), and stomach. Allergies can be mild or severe. Allergies cannot spread from person to person (are not contagious). They can develop at any age and may be outgrown. °What increases the risk? °You may be at greater risk of allergies if other people in your family have allergies. °What are the signs or symptoms? °Symptoms depend on what type of allergy you have. They may include: °· Runny, stuffy nose. °· Sneezing. °· Itchy mouth, ears, or throat. °· Postnasal drip. °· Sore throat. °· Itchy, red, watery, or puffy eyes. °· Skin rash or hives. °· Stomach pain. °· Vomiting. °· Diarrhea. °· Bloating. °· Wheezing or coughing. °People with a severe allergy to food, medicine, or an insect bite may have a life-threatening allergic reaction (anaphylaxis). Symptoms of anaphylaxis include: °· Hives. °· Itching. °· Flushed face. °· Swollen lips, tongue, or mouth. °· Tight or swollen throat. °· Chest pain or tightness in the chest. °· Trouble breathing or shortness of breath. °· Rapid heartbeat. °· Dizziness or fainting. °· Vomiting. °· Diarrhea. °· Pain in the abdomen. °How is this diagnosed? °This condition is diagnosed based on: °· Your symptoms. °· Your family and medical history. °· A physical exam. °You may need to see a health care provider who specializes in treating allergies (allergist). You may also have tests, including: °· Skin tests to see which allergens are causing  your symptoms, such as: °? Skin prick test. In this test, your skin is pricked with a tiny needle and exposed to small amounts of possible allergens to see if your skin reacts. °? Intradermal skin test. In this test, a small amount of allergen is injected under your skin to see if your skin reacts. °? Patch test. In this test, a small amount of allergen is placed on your skin and then your skin is covered with a bandage. Your health care provider will check your skin after a couple of days to see if a rash has developed. °· Blood tests. °· Challenges tests. In this test, you inhale a small amount of allergen by mouth to see if you have an allergic reaction. °You may also be asked to: °· Keep a food diary. A food diary is a record of all the foods and drinks you have in a day and any symptoms you experience. °· Practice an elimination diet. An elimination diet involves eliminating specific foods from your diet and then adding them back in one by one to find out if a certain food causes an allergic reaction. °How is this treated? °Treatment for allergies depends on your symptoms. Treatment may include: °· Cold compresses to soothe itching and swelling. °· Eye drops. °· Nasal sprays. °· Using a saline spray or container (neti pot) to flush out the nose (nasal irrigation). These methods can help clear away mucus and keep the nasal passages moist. °· Using a humidifier. °· Oral antihistamines or other medicines to block allergic reaction and inflammation. °· Skin creams to treat rashes or itching. °· Diet changes to eliminate food allergy triggers. °·   Repeated exposure to tiny amounts of allergens to build up a tolerance and prevent future allergic reactions (immunotherapy). These include: °? Allergy shots. °? Oral treatment. This involves taking small doses of an allergen under the tongue (sublingual immunotherapy). °· Emergency epinephrine injection (auto-injector) in case of an allergic emergency. This is a  self-injectable, pre-measured medicine that must be given within the first few minutes of a serious allergic reaction. °Follow these instructions at home: ° °  ° °  ° °· Avoid known allergens whenever possible. °· If you suffer from airborne allergens, wash out your nose daily. You can do this with a saline spray or a neti pot to flush out your nose (nasal irrigation). °· Take over-the-counter and prescription medicines only as told by your health care provider. °· Keep all follow-up visits as told by your health care provider. This is important. °· If you are at risk of a severe allergic reaction (anaphylaxis), keep your auto-injector with you at all times. °· If you have ever had anaphylaxis, wear a medical alert bracelet or necklace that states you have a severe allergy. °Contact a health care provider if: °· Your symptoms do not improve with treatment. °Get help right away if: °· You have symptoms of anaphylaxis, such as: °? Swollen mouth, tongue, or throat. °? Pain or tightness in your chest. °? Trouble breathing or shortness of breath. °? Dizziness or fainting. °? Severe abdominal pain, vomiting, or diarrhea. °This information is not intended to replace advice given to you by your health care provider. Make sure you discuss any questions you have with your health care provider. °Document Revised: 11/26/2017 Document Reviewed: 03/20/2016 °Elsevier Patient Education © 2020 [employer] ° °

## 2019-11-15 NOTE — Progress Notes (Signed)
Telehealth office visit note for Christina Krueger, D.O- at Primary Care at [facility]   I connected with current patient today and verified that I am speaking with the correct person using two identifiers.   . Location of the patient: Home . Location of the provider: Office Only the patient (+/- their family members at pt's discretion) and myself were participating in the encounter - This visit type was conducted due to national recommendations for restrictions regarding the COVID-19 Pandemic (e.g. social distancing) in an effort to limit this patient's exposure and mitigate transmission in our community.  This format is felt to be most appropriate for this patient at this time.   - No physical exam could be performed with this format, beyond that communicated to [place] by the patient/ family members as noted.   - Additionally my office staff/ schedulers discussed with the patient that there may be a monetary charge related to this service, depending on their medical insurance.   The patient expressed understanding, and agreed to proceed.     _________________________________________________________________________________   History of Present Illness: Sinusitis (x 1 month), Headache, and Diarrhea     I, Peggye Fothergill, am serving as Neurosurgeon for [employer].  Notes she has not been in for an OV in so long because she lost insurance due to COVID and finally got insurance back.  - Sinus Concerns She's been having sinus issues going on for over a month.  For relief, she has been taking sudafed and Advil Cold & Sinus.  Notes her symptoms are "fine when I'm taking [the OTC meds], but when I'm not taking it, everything comes back, and I'm getting these massive headaches."  She has headaches in the middle of the night that wake her up due to the pain.  Her pain is located mostly over her forehead, cheekbones, "and now I'm feeling it in the back of my neck."  Notes if she's sitting up,  bends over, looks down, she feels massive pain in the forehead and in her cheeks, and when she blows her nose, "it's just like chunks."  Says certain times when she blows her nose or moves a certain way, she feels "crackling" in her cheekbones.  Denies fevers; she has her temperature checked every morning at work.  Denies one-sided face pain.  In the past, for allergies, she has tried Claritin.  In terms of recent acute allergen exposure, denies cleaning house, attic, closets, etc.  Says "I've just been taking the sudafed and Advil thinking it would help."    - Elevated BP due to Use of Sudafed Her blood pressure at home has been about the same, around 138/92.      GAD 7 : Generalized Anxiety Score 11/13/2017  Nervous, Anxious, on Edge 1  Control/stop worrying 0  Worry too much - different things 0  Trouble relaxing 0  Restless 0  Easily annoyed or irritable 0  Afraid - awful might happen 0  Total GAD 7 Score 1  Anxiety Difficulty Not difficult at all    Depression screen [employer] 2/9 11/15/2019 09/29/2018 01/29/2018 11/13/2017 10/23/2017  Decreased Interest 0 0 0 0 0  Down, Depressed, Hopeless 0 0 0 0 0  PHQ - 2 Score 0 0 0 0 0  Altered sleeping 3 2 0 0 1  Tired, decreased energy 0 0 0 0 0  Change in appetite 0 0 0 0 0  Feeling bad or failure about yourself  0 0  0 0 0  Trouble concentrating 0 0 0 0 0  Moving slowly or fidgety/restless 0 0 0 0 0  Suicidal thoughts 0 0 0 0 0  PHQ-9 Score 3 2 0 0 1  Difficult doing work/chores Not difficult at all - Not difficult at all Not difficult at all Not difficult at all      Impression and Recommendations:    1. Chronic pansinusitis   2. Environmental and seasonal allergies       - Patient last seen in office 09/29/2018.  Chronic Pansinusitis, Environmental & Seasonal Allergies - Reviewed patient's symptoms extensively during appointment today. - Discussed possible causes of patient's symptoms.  - Per patient, can tolerate  steroids. - Advised patient to discontinue sudafed and Advil Cold & Sinus.  - Begin oral steroid today as prescribed.  See med list. - Told patient to avoid use of Advil, Alleve, ibuprofen while on steroids.  - Begin two nasal sprays as prescribed.  See med list.  -  Begin allergy medication (Xyzal) today.  See med list.   - If tolerated, advised the patient to begin using AYR or Neilmed sinus rinses BID followed by each nasal spray BID (one spray to each nostril).  - Advised patient to hydrate adequately to help thin out mucus.  - If patient develops fevers over 100 F, or one-sided face pain, antibiotics may be indicated.  Patient knows to monitor for these symptoms.  Recommendations - Return near future for CPE and fasting blood work. - If additional concerns, please make additional follow-up.   - As part of my medical decision making, I reviewed the following data within the electronic MEDICAL RECORD NUMBER History obtained from pt /family, CMA notes reviewed and incorporated if applicable, Labs reviewed, Radiograph/ tests reviewed if applicable and OV notes from prior OV's with me, as well as other specialists she/he has seen since seeing me last, were all reviewed and used in my medical decision making process today.    - Additionally, discussion had with patient regarding our treatment plan, and their biases/concerns about that plan were used in my medical decision making today.    - The patient agreed with the plan and demonstrated an understanding of the instructions.   No barriers to understanding were identified.    - Red flag symptoms and signs discussed in detail.  Patient expressed understanding regarding what to do in case of emergency\ urgent symptoms.   - The patient was advised to call back or seek an in-person evaluation if the symptoms worsen or if the condition fails to improve as anticipated.   Return for CPE and full fasting blood work near future, additional f/up for  additional concerns.    Meds ordered this encounter  Medications  . fluticasone (FLONASE) 50 MCG/ACT nasal spray    Sig: 1 spray each nostril following NeilMed or AYR sinus rinses every 12 hours    Dispense:  16 g    Refill:  2  . azelastine (ASTELIN) 0.1 % nasal spray    Sig: Place 1 spray into both nostrils 2 (two) times daily. 1 spray each nostril twice daily after sinus rinses    Dispense:  30 mL    Refill:  2  . levocetirizine (XYZAL) 5 MG tablet    Sig: Take 1 tablet (5 mg total) by mouth every evening.    Dispense:  90 tablet    Refill:  0  . predniSONE (DELTASONE) 20 MG tablet    Sig: Take  3 pills a day for 2 days, 2 pills a day for 2 days, 1 pill a day for 2 days then one half pill a day for 2 days then off    Dispense:  14 tablet    Refill:  0     Medications Discontinued During This Encounter  Medication Reason  . oseltamivir (TAMIFLU) 75 MG capsule Error  . indomethacin (INDOCIN) 50 MG capsule Error  . chlorpheniramine-HYDROcodone (TUSSIONEX) 10-8 MG/5ML SUER Error  . buPROPion (WELLBUTRIN) 75 MG tablet Error  . Phenylephrine-DM-GG-APAP (TYLENOL COLD/FLU SEVERE) 5-10-200-325 MG TABS   . Pseudoephedrine HCl (SUDAFED CONGESTION PO)      Time spent on visit including pre-visit chart review and post-visit care was 11 minutes spent in total  Note:  This note was prepared with assistance of Dragon voice recognition software. Occasional wrong-word or sound-a-like substitutions may have occurred due to the inherent limitations of voice recognition software.   The 21st Century Cures Act was signed into law in 2016 which includes the topic of electronic health records.  This provides immediate access to information in MyChart.  This includes consultation notes, operative notes, office notes, lab results and pathology reports.  If you have any questions about what you read please let us know at your next visit or call us at the office.  We are right here with you.   This  document serves as a record of services personally performed by Christina Lot, DO. It was created on her behalf by Peggye Fothergill, a trained medical scribe. The creation of this record is based on the scribe's personal observations and the provider's statements to them.   This case required medical decision making of at least moderate complexity. The above documentation from Peggye Fothergill, medical scribe, has been reviewed by Carlye Grippe, D.O.    __________________________________________________________________________________     Patient Care Team    Relationship Specialty Notifications Start End  Christina Lot, DO PCP - General Family Medicine  11/13/17      -Vitals obtained; medications/ allergies reconciled;  personal medical, social, Sx etc.histories were updated by CMA, reviewed by me and are reflected in chart   Patient Active Problem List   Diagnosis Date Noted  . Chronic pansinusitis 11/15/2019  . Environmental and seasonal allergies 11/15/2019  . Influenza 11/24/2018  . Cough in adult patient 11/24/2018  . 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 Elevated antinuclear antibody (ANA) level 09/29/2018  . Pain in both knees 09/29/2018  . Elevated liver enzymes 09/29/2018  . Excessive drinking alcohol 09/29/2018  . 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 GAD (generalized anxiety disorder) 01/29/2018  . Tobacco use disorder 01/29/2018  . Tobacco abuse counseling 01/29/2018  . Depression, recurrent (HCC) 01/29/2018  . Adjustment disorder 11/13/2017  . Vitamin D deficiency 11/13/2017  . Mixed hyperlipidemia 11/13/2017  . Hypertriglyceridemia 11/13/2017  . Poor diet 11/13/2017  . Inactivity 11/13/2017  . Dysmenorrhea, unspecified 11/13/2017  . Hypertension 10/23/2017  . History of depression 10/23/2017  . Stress at work 10/23/2017  . Insomnia due to stress 10/23/2017     Current Meds  Medication Sig  . colchicine 0.6 MG tablet Take 1 tablet (0.6 mg  total) by mouth 2 (two) times daily. Start within 24hrs of gouty flair; take until jt pain improves or Diarhea  . Olmesartan-amLODIPine-HCTZ 20-5-12.5 MG TABS TAKE 1 TABLET BY MOUTH ONCE DAILY. PATIENT WILL NEED OFFICE VISIT BEFORE ANY REFILLS.  Marland Kitchen Vitamin D, Ergocalciferol, (DRISDOL) 1.25 MG (50000 UT) CAPS capsule Take 1 capsule  Wednesdays and Sundays.  . [DISCONTINUED] Phenylephrine-DM-GG-APAP (TYLENOL COLD/FLU SEVERE) 5-10-200-325 MG TABS Take by mouth.  . [DISCONTINUED] Pseudoephedrine HCl (SUDAFED CONGESTION PO) Take by mouth.     Allergies:  No Known Allergies   ROS:  See above HPI for pertinent positives and negatives   Objective:   Blood pressure (!) 138/92, temperature (!) 97.2 F (36.2 C), temperature source Temporal, height 5\' 4"  (1.626 m), weight 150 lb (68 kg), last menstrual period 11/01/2019.  (if some vitals are omitted, this means that patient was UNABLE to obtain them even though they were asked to get them prior to OV today.  They were asked to call us at their earliest convenience with these once obtained. )  General: A & O * 3; sounds in no acute distress; in usual state of health.  Skin: Pt confirms warm and dry extremities and pink fingertips HEENT: Pt confirms lips non-cyanotic Chest: Patient confirms normal chest excursion and movement Respiratory: speaking in full sentences, no conversational dyspnea; patient confirms no use of accessory muscles Psych: insight appears good, mood- appears full

## 2019-12-06 ENCOUNTER — Other Ambulatory Visit: Payer: Self-pay

## 2019-12-06 ENCOUNTER — Encounter: Payer: Self-pay | Admitting: Emergency Medicine

## 2019-12-06 ENCOUNTER — Ambulatory Visit
Admission: EM | Admit: 2019-12-06 | Discharge: 2019-12-06 | Disposition: A | Discharge status: Home / Self Care | Payer: PRIVATE HEALTH INSURANCE | Attending: Emergency Medicine | Admitting: Emergency Medicine

## 2019-12-06 DIAGNOSIS — R1031 Right lower quadrant pain: Secondary | ICD-10-CM | POA: Insufficient documentation

## 2019-12-06 NOTE — ED Notes (Signed)
Patient able to ambulate independently  

## 2019-12-06 NOTE — Discharge Instructions (Addendum)
Take NSAID (ibuprofen) and use heating pad for pain. Follow-up with PCP for possible ultrasound of area to evaluate for hernia. Test pending: We will call you if any of your results are positive and need treatment. In the interim, go to the ER for worsening pain, fever, blood in stool.

## 2019-12-06 NOTE — ED Triage Notes (Signed)
Pt presents to [facility] for assessment of knot to RLQ she noted this morning in the shower.  States the pain is worsening, and is spreading to right lower back and right leg.   C/o nausea, c/o loose stools.

## 2019-12-06 NOTE — ED Provider Notes (Signed)
[facility]    CSN: [id number] Arrival date & time: 12/06/19  1322      History   Chief Complaint Chief Complaint  Patient presents with  . Abdominal Pain    HPI Christina Krueger is a 39 y.o. female with history of hypertension presenting for right groin pain since this morning.  States since then it has been hurting more at work: Hurts to sit, cough, sneeze.  Patient denied history of constipation.  Did have episode of diarrhea last night without blood or melena.  Patient denied fever, myalgias, arthralgias.  No known history of hernia.  Patient currently sexually active with 1 female partner: Denies pelvic or vaginal pain, vaginal discharge, new anogenital lesions.  Patient currently on her cycle.  Has not taken anything for pain.   Past Medical History:  Diagnosis Date  . Hypertension     Patient Active Problem List   Diagnosis Date Noted  . Chronic pansinusitis 11/15/2019  . Environmental and seasonal allergies 11/15/2019  . Influenza 11/24/2018  . Cough in adult patient 11/24/2018  . 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 Elevated antinuclear antibody (ANA) level 09/29/2018  . Pain in both knees 09/29/2018  . Elevated liver enzymes 09/29/2018  . Excessive drinking alcohol 09/29/2018  . 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 GAD (generalized anxiety disorder) 01/29/2018  . Tobacco use disorder 01/29/2018  . Tobacco abuse counseling 01/29/2018  . Depression, recurrent ([place]) 01/29/2018  . Adjustment disorder 11/13/2017  . Vitamin D deficiency 11/13/2017  . Mixed hyperlipidemia 11/13/2017  . Hypertriglyceridemia 11/13/2017  . Poor diet 11/13/2017  . Inactivity 11/13/2017  . Dysmenorrhea, unspecified 11/13/2017  . Hypertension 10/23/2017  . History of depression 10/23/2017  . Stress at work 10/23/2017  . Insomnia due to stress 10/23/2017    Past Surgical History:  Procedure Laterality Date  . TUBAL LIGATION      OB History   No obstetric history on  file.      Home Medications    Prior to Admission medications   Medication Sig Start Date End Date Taking? Authorizing Provider  azelastine (ASTELIN) 0.1 % nasal spray Place 1 spray into both nostrils 2 (two) times daily. 1 spray each nostril twice daily after sinus rinses 11/15/19   Opalski, Deborah, DO  colchicine 0.6 MG tablet Take 1 tablet (0.6 mg total) by mouth 2 (two) times daily. Start within 24hrs of gouty flair; take until jt pain improves or Diarhea 09/29/18   Opalski, Neoma Laming, DO  fluticasone (FLONASE) 50 MCG/ACT nasal spray 1 spray each nostril following NeilMed or AYR sinus rinses every 12 hours 11/15/19   Opalski, Neoma Laming, DO  levocetirizine (XYZAL) 5 MG tablet Take 1 tablet (5 mg total) by mouth every evening. 11/15/19   Opalski, Deborah, DO  Olmesartan-amLODIPine-HCTZ 20-5-12.5 MG TABS TAKE 1 TABLET BY MOUTH ONCE DAILY. PATIENT WILL NEED OFFICE VISIT BEFORE ANY REFILLS. 10/05/18   Mellody Dance, DO  Vitamin D, Ergocalciferol, (DRISDOL) 1.25 MG (50000 UT) CAPS capsule Take 1 capsule Wednesdays and Sundays. 09/29/18   Mellody Dance, DO    Family History Family History  Problem Relation Age of Onset  . Hypertension Mother   . Hypertension Father     Social History Social History   Tobacco Use  . Smoking status: Current Every Day Smoker    Packs/day: 0.25    Types: Cigarettes  . Smokeless tobacco: Never Used  Substance Use Topics  . Alcohol use: Yes    Alcohol/week: 12.0 standard drinks    Types:  12 Standard drinks or equivalent per week  . Drug use: No     Allergies   Patient has no known allergies.   Review of Systems As per HPI   Physical Exam Triage Vital Signs ED Triage Vitals  Enc Vitals Group     BP 12/06/19 1334 (!) 162/81     Pulse Rate 12/06/19 1334 (!) 103     Resp 12/06/19 1334 16     Temp 12/06/19 1334 98.7 F (37.1 C)     Temp Source 12/06/19 1334 Oral     SpO2 12/06/19 1334 98 %     Weight --      Height --      Head Circumference  --      Peak Flow --      Pain Score 12/06/19 1341 6     Pain Loc --      Pain Edu? --      Excl. in GC? --    No data found.  Updated Vital Signs BP (!) 162/81 (BP Location: Left Arm)   Pulse (!) 103   Temp 98.7 F (37.1 C) (Oral)   Resp 16   LMP 12/03/2019   SpO2 98%   Visual Acuity Right Eye Distance:   Left Eye Distance:   Bilateral Distance:    Right Eye Near:   Left Eye Near:    Bilateral Near:     Physical Exam Constitutional:      General: She is not in acute distress. HENT:     Head: Normocephalic and atraumatic.  Eyes:     General: No scleral icterus.    Pupils: Pupils are equal, round, and reactive to light.  Cardiovascular:     Rate and Rhythm: Normal rate.  Pulmonary:     Effort: Pulmonary effort is normal.  Abdominal:     General: Bowel sounds are normal. There is no distension.     Palpations: Abdomen is soft.     Comments: Right groin pain.  Mild bulging noted to right groin with Valsalva as compared to left.  Mild inguinal LAD of right noted as compared to left: Tender to palpation.  Genitourinary:    Comments: Patient declined: Self swab performed Skin:    Coloration: Skin is not jaundiced or pale.  Neurological:     Mental Status: She is alert and oriented to person, place, and time.      UC Treatments / Results  Labs (all labs ordered are listed, but only abnormal results are displayed) Labs Reviewed  CERVICOVAGINAL ANCILLARY ONLY    EKG   Radiology No results found.  Procedures Procedures (including critical care time)  Medications Ordered in UC Medications - No data to display  Initial Impression / Assessment and Plan / UC Course  I have reviewed the triage vital signs and the nursing notes.  Pertinent labs & imaging results that were available during my care of the patient were reviewed by me and considered in my medical decision making (see chart for details).     Patient afebrile, nontoxic in office today.   History concerning for hernia.  Physical exam inconclusive for hernia, though LAD is noted that is tender.  Will obtain cytology for STI and treat if indicated.  Reviewed supportive measures for lymphadenitis as well as follow-up for further evaluation of possible hernia with PCP.  Return precautions discussed, patient verbalized understanding and is agreeable to plan. Final Clinical Impressions(s) / UC Diagnoses   Final diagnoses:  Right groin  pain     Discharge Instructions     Take NSAID (ibuprofen) and use heating pad for pain. Follow-up with PCP for possible ultrasound of area to evaluate for hernia. Test pending: We will call you if any of your results are positive and need treatment. In the interim, go to the ER for worsening pain, fever, blood in stool.    ED Prescriptions    None     PDMP not reviewed this encounter.   Hall[place], [place], [place] 12/06/19 1415

## 2019-12-07 LAB — CERVICOVAGINAL ANCILLARY ONLY
Chlamydia: NEGATIVE
Neisseria Gonorrhea: NEGATIVE
Trichomonas: NEGATIVE

## 2019-12-14 ENCOUNTER — Ambulatory Visit (INDEPENDENT_AMBULATORY_CARE_PROVIDER_SITE_OTHER): Payer: PRIVATE HEALTH INSURANCE | Admitting: Family Medicine

## 2019-12-14 ENCOUNTER — Other Ambulatory Visit: Payer: Self-pay

## 2019-12-14 ENCOUNTER — Encounter: Payer: Self-pay | Admitting: Family Medicine

## 2019-12-14 VITALS — BP 145/101 | Temp 98.7°F | Ht 64.0 in | Wt 152.0 lb

## 2019-12-14 DIAGNOSIS — F411 Generalized anxiety disorder: Secondary | ICD-10-CM

## 2019-12-14 DIAGNOSIS — I10 Essential (primary) hypertension: Secondary | ICD-10-CM | POA: Diagnosis not present

## 2019-12-14 DIAGNOSIS — F410 Panic disorder [episodic paroxysmal anxiety] without agoraphobia: Secondary | ICD-10-CM | POA: Diagnosis not present

## 2019-12-14 DIAGNOSIS — E663 Overweight: Secondary | ICD-10-CM

## 2019-12-14 DIAGNOSIS — E559 Vitamin D deficiency, unspecified: Secondary | ICD-10-CM

## 2019-12-14 DIAGNOSIS — F5102 Adjustment insomnia: Secondary | ICD-10-CM

## 2019-12-14 DIAGNOSIS — R748 Abnormal levels of other serum enzymes: Secondary | ICD-10-CM

## 2019-12-14 MED ORDER — BUSPIRONE HCL 5 MG PO TABS: ORAL_TABLET | ORAL | 0 refills | Status: DC

## 2019-12-14 MED ORDER — SERTRALINE HCL 25 MG PO TABS: 25.0000 mg | ORAL_TABLET | Freq: Every day | ORAL | 0 refills | Status: DC

## 2019-12-14 MED ORDER — PROPRANOLOL HCL 10 MG PO TABS: 10.0000 mg | ORAL_TABLET | Freq: Three times a day (TID) | ORAL | 0 refills | Status: DC

## 2019-12-14 MED ORDER — TRAZODONE HCL 100 MG PO TABS: ORAL_TABLET | ORAL | 0 refills | Status: DC

## 2019-12-14 MED ORDER — VITAMIN D (ERGOCALCIFEROL) 1.25 MG (50000 UNIT) PO CAPS: ORAL_CAPSULE | ORAL | 3 refills | Status: DC

## 2019-12-14 MED ORDER — OLMESARTAN-AMLODIPINE-HCTZ 20-5-12.5 MG PO TABS: ORAL_TABLET | ORAL | 0 refills | Status: DC
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
## 2019-12-14 NOTE — Patient Instructions (Addendum)
Please schedule next available physical examination with full fasting blood work in the future, less than 3 months.  Please download meditation apps (Breathe, Headspace, Ten Percent, Calm) or search online for "deep breathing techniques for anxiety and panic."  Engage in a breathing technique twice per day, for at least 10-15 minutes.

## 2019-12-16 ENCOUNTER — Other Ambulatory Visit: Payer: Self-pay

## 2019-12-16 ENCOUNTER — Encounter: Payer: Self-pay | Admitting: *Deleted

## 2019-12-16 ENCOUNTER — Emergency Department
Admission: EM | Admit: 2019-12-16 | Discharge: 2019-12-16 | Disposition: A | Discharge status: Home / Self Care | Payer: PRIVATE HEALTH INSURANCE | Attending: Emergency Medicine | Admitting: Emergency Medicine

## 2019-12-16 DIAGNOSIS — I10 Essential (primary) hypertension: Secondary | ICD-10-CM | POA: Insufficient documentation

## 2019-12-16 DIAGNOSIS — F410 Panic disorder [episodic paroxysmal anxiety] without agoraphobia: Secondary | ICD-10-CM

## 2019-12-16 DIAGNOSIS — F17210 Nicotine dependence, cigarettes, uncomplicated: Secondary | ICD-10-CM | POA: Insufficient documentation

## 2019-12-16 DIAGNOSIS — Z79899 Other long term (current) drug therapy: Secondary | ICD-10-CM | POA: Diagnosis not present

## 2019-12-16 LAB — BASIC METABOLIC PANEL
Anion gap: 14 (ref 5–15)
BUN: 13 mg/dL (ref 6–20)
CO2: 24 mmol/L (ref 22–32)
Calcium: 8.9 mg/dL (ref 8.9–10.3)
Chloride: 99 mmol/L (ref 98–111)
Creatinine, Ser: 0.76 mg/dL (ref 0.44–1.00)
GFR calc Af Amer: >60 mL/min (ref >60)
GFR calc non Af Amer: >60 mL/min (ref >60)
Glucose, Bld: 122 mg/dL — ABNORMAL HIGH (ref 70–99)
Potassium: 4.2 mmol/L (ref 3.5–5.1)
Sodium: 137 mmol/L (ref 135–145)

## 2019-12-16 LAB — CBC
HCT: 39.3 % (ref 36.0–46.0)
Hemoglobin: 13.3 g/dL (ref 12.0–15.0)
MCH: 32 pg (ref 26.0–34.0)
MCHC: 33.8 g/dL (ref 30.0–36.0)
MCV: 94.5 fL (ref 80.0–100.0)
Platelets: 373 10*3/uL (ref 150–400)
RBC: 4.16 MIL/uL (ref 3.87–5.11)
RDW: 12.6 % (ref 11.5–15.5)
WBC: 10.1 10*3/uL (ref 4.0–10.5)
nRBC: 0 % (ref 0.0–0.2)

## 2019-12-16 LAB — T4, FREE: Free T4: 0.85 ng/dL (ref 0.61–1.12)

## 2019-12-16 LAB — TSH: TSH: 3.071 u[IU]/mL (ref 0.350–4.500)

## 2019-12-16 LAB — TROPONIN I (HIGH SENSITIVITY): Troponin I (High Sensitivity): 3 ng/L (ref <18)

## 2019-12-16 MED ORDER — DIAZEPAM 5 MG PO TABS
10.0000 mg | ORAL_TABLET | Freq: Once | ORAL | Status: AC
  Administered 2019-12-16: 19:00:00 10 mg via ORAL
  Filled 2019-12-16: qty 2

## 2019-12-16 MED ORDER — LORAZEPAM 0.5 MG PO TABS: 0.5000 mg | ORAL_TABLET | Freq: Three times a day (TID) | ORAL | 0 refills | Status: AC | PRN

## 2019-12-16 NOTE — ED Provider Notes (Signed)
[facility] Emergency Department Provider Note       Time seen: ----------------------------------------- 6:35 PM on 12/16/2019 -----------------------------------------   I have reviewed the triage vital signs and the nursing notes.  HISTORY   Chief Complaint Panic Attack    HPI Christina Krueger is a 39 y.o. female with a history of hypertension, panic anxiety syndrome, depression who presents to the ED for tingling in her hands, dry mouth, numbness in her legs and tightness in her neck.  Last panic attack was 5 days ago.  She reportedly was started on a new medication for panic attacks.  Past Medical History:  Diagnosis Date  . Hypertension     Patient Active Problem List   Diagnosis Date Noted  . Panic anxiety syndrome 12/14/2019  . Chronic pansinusitis 11/15/2019  . Environmental and seasonal allergies 11/15/2019  . Influenza 11/24/2018  . Cough in adult patient 11/24/2018  . 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 Elevated antinuclear antibody (ANA) level 09/29/2018  . Pain in both knees 09/29/2018  . Elevated liver enzymes 09/29/2018  . Excessive drinking alcohol 09/29/2018  . 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 GAD (generalized anxiety disorder) 01/29/2018  . Tobacco use disorder 01/29/2018  . Tobacco abuse counseling 01/29/2018  . Depression, recurrent ([place]) 01/29/2018  . Adjustment disorder 11/13/2017  . Vitamin D deficiency 11/13/2017  . Mixed hyperlipidemia 11/13/2017  . Hypertriglyceridemia 11/13/2017  . Poor diet 11/13/2017  . Inactivity 11/13/2017  . Dysmenorrhea, unspecified 11/13/2017  . Hypertension 10/23/2017  . History of depression 10/23/2017  . Stress at work 10/23/2017  . Insomnia due to stress 10/23/2017    Past Surgical History:  Procedure Laterality Date  . TUBAL LIGATION      Allergies Patient has no known allergies.  Social History Social History   Tobacco Use  . Smoking status: Current Every Day  Smoker    Packs/day: 0.25    Types: Cigarettes  . Smokeless tobacco: Never Used  Substance Use Topics  . Alcohol use: Yes    Alcohol/week: 12.0 standard drinks    Types: 12 Standard drinks or equivalent per week  . Drug use: No    Review of Systems Constitutional: Negative for fever. Cardiovascular: Negative for chest pain. Respiratory: Negative for shortness of breath. Gastrointestinal: Negative for abdominal pain, vomiting and diarrhea. Musculoskeletal: Negative for back pain. Skin: Negative for rash. Neurological: Negative for headaches, focal weakness or numbness. Psychiatric: Positive for anxiety  All systems negative/normal/unremarkable except as stated in the HPI  ____________________________________________   PHYSICAL EXAM:  VITAL SIGNS: ED Triage Vitals  Enc Vitals Group     BP 12/16/19 1553 123/87     Pulse Rate 12/16/19 1553 (!) 101     Resp 12/16/19 1553 18     Temp 12/16/19 1553 99.8 F (37.7 C)     Temp Source 12/16/19 1553 Oral     SpO2 12/16/19 1553 98 %     Weight 12/16/19 1554 150 lb (68 kg)     Height 12/16/19 1554 5\' 4"  (1.626 m)     Head Circumference --      Peak Flow --      Pain Score --      Pain Loc --      Pain Edu? --      Excl. in [place]? --     Constitutional: Alert and oriented. Well appearing and in no distress. Eyes: Conjunctivae are normal. Normal extraocular movements. Cardiovascular: Normal rate, regular rhythm. No murmurs, rubs, or gallops. Respiratory:  Normal respiratory effort without tachypnea nor retractions. Breath sounds are clear and equal bilaterally. No wheezes/rales/rhonchi. Gastrointestinal: Soft and nontender. Normal bowel sounds Musculoskeletal: Nontender with normal range of motion in extremities. No lower extremity tenderness nor edema. Neurologic:  Normal speech and language. No gross focal neurologic deficits are appreciated.  Skin:  Skin is warm, dry and intact. No rash noted. Psychiatric: Anxious mood and  affect ____________________________________________  ED COURSE:  As part of my medical decision making, I reviewed the following data within the electronic MEDICAL RECORD NUMBER History obtained from family if available, nursing notes, old chart and ekg, as well as notes from prior ED visits. Patient presented for symptoms of a panic attack, we will assess with labs and imaging as indicated at this time.   Procedures  Christina Krueger was evaluated in Emergency Department on 12/16/2019 for the symptoms described in the history of present illness. She was evaluated in the context of the global COVID-19 pandemic, which necessitated consideration that the patient might be at risk for infection with the SARS-CoV-2 virus that causes COVID-19. Institutional protocols and algorithms that pertain to the evaluation of patients at risk for COVID-19 are in a state of rapid change based on information released by regulatory bodies including the CDC and federal and state organizations. These policies and algorithms were followed during the patient's care in the ED.  ____________________________________________   LABS (pertinent positives/negatives)  Labs Reviewed  BASIC METABOLIC PANEL - Abnormal; Notable for the following components:      Result Value   Glucose, Bld 122 (*)    All other components within normal limits  CBC  TSH  T4, FREE  TROPONIN I (HIGH SENSITIVITY)   ____________________________________________   DIFFERENTIAL DIAGNOSIS   Anxiety, panic attack, dehydration, electrolyte abnormality  FINAL ASSESSMENT AND PLAN  Panic attack   Plan: The patient had presented for symptoms of a panic attack. Patient's labs were reassuring.  Her doctor had written for BuSpar and Zoloft but she had not started them yet.  She was still very anxious here and was given oral Valium.  She will be discharged home with as needed Ativan until BuSpar and Zoloft have been started.   Ulice Dash,  MD    Note: This note was generated in part or whole with voice recognition software. Voice recognition is usually quite accurate but there are transcription errors that can and very often do occur. I apologize for any typographical errors that were not detected and corrected.     Emily Filbert, MD 12/16/19 1944

## 2019-12-16 NOTE — ED Notes (Signed)
First Nurse note  Presents with some dizziness this afternoon  Feels weak

## 2019-12-16 NOTE — ED Triage Notes (Signed)
Pt reports tingling in hands, dry mouth, numbness in legs, tightness in neck and feeling tired.  Hx panic attacks.. last one was 5 days ago.  Saw doctor after last attack and was started on new med for panic attacks.   No chest pain or sob.  Pt alert  Speech clear.

## 2019-12-17 ENCOUNTER — Encounter: Payer: Self-pay | Admitting: Family Medicine

## 2020-01-11 ENCOUNTER — Other Ambulatory Visit: Payer: PRIVATE HEALTH INSURANCE

## 2020-01-11 ENCOUNTER — Other Ambulatory Visit: Payer: Self-pay

## 2020-01-11 DIAGNOSIS — R748 Abnormal levels of other serum enzymes: Secondary | ICD-10-CM

## 2020-01-11 DIAGNOSIS — E559 Vitamin D deficiency, unspecified: Secondary | ICD-10-CM

## 2020-01-11 DIAGNOSIS — E663 Overweight: Secondary | ICD-10-CM

## 2020-01-11 DIAGNOSIS — I10 Essential (primary) hypertension: Secondary | ICD-10-CM

## 2020-01-11 DIAGNOSIS — F410 Panic disorder [episodic paroxysmal anxiety] without agoraphobia: Secondary | ICD-10-CM

## 2020-01-11 DIAGNOSIS — F5102 Adjustment insomnia: Secondary | ICD-10-CM

## 2020-01-11 DIAGNOSIS — F411 Generalized anxiety disorder: Secondary | ICD-10-CM

## 2020-01-12 LAB — COMPREHENSIVE METABOLIC PANEL
ALT: 37 IU/L — ABNORMAL HIGH (ref 0–32)
AST: 57 IU/L — ABNORMAL HIGH (ref 0–40)
Albumin/Globulin Ratio: 1.5 (ref 1.2–2.2)
Albumin: 4.1 g/dL (ref 3.8–4.8)
Alkaline Phosphatase: 64 IU/L (ref 39–117)
BUN/Creatinine Ratio: 12 (ref 9–23)
BUN: 8 mg/dL (ref 6–20)
Bilirubin Total: 0.3 mg/dL (ref 0.0–1.2)
CO2: 22 mmol/L (ref 20–29)
Calcium: 9.2 mg/dL (ref 8.7–10.2)
Chloride: 98 mmol/L (ref 96–106)
Creatinine, Ser: 0.65 mg/dL (ref 0.57–1.00)
GFR calc Af Amer: 130 mL/min/{1.73_m2} (ref >59)
GFR calc non Af Amer: 113 mL/min/{1.73_m2} (ref >59)
Globulin, Total: 2.7 g/dL (ref 1.5–4.5)
Glucose: 99 mg/dL (ref 65–99)
Potassium: 4.1 mmol/L (ref 3.5–5.2)
Sodium: 139 mmol/L (ref 134–144)
Total Protein: 6.8 g/dL (ref 6.0–8.5)

## 2020-01-12 LAB — CBC WITH DIFFERENTIAL/PLATELET
Basophils Absolute: 0.1 10*3/uL (ref 0.0–0.2)
Basos: 1 %
EOS (ABSOLUTE): 0.3 10*3/uL (ref 0.0–0.4)
Eos: 4 %
Hematocrit: 38.7 % (ref 34.0–46.6)
Hemoglobin: 13.1 g/dL (ref 11.1–15.9)
Immature Grans (Abs): 0 10*3/uL (ref 0.0–0.1)
Immature Granulocytes: 1 %
Lymphocytes Absolute: 3.2 10*3/uL — ABNORMAL HIGH (ref 0.7–3.1)
Lymphs: 44 %
MCH: 31.3 pg (ref 26.6–33.0)
MCHC: 33.9 g/dL (ref 31.5–35.7)
MCV: 92 fL (ref 79–97)
Monocytes Absolute: 0.5 10*3/uL (ref 0.1–0.9)
Monocytes: 7 %
Neutrophils Absolute: 3.1 10*3/uL (ref 1.4–7.0)
Neutrophils: 43 %
Platelets: 390 10*3/uL (ref 150–450)
RBC: 4.19 x10E6/uL (ref 3.77–5.28)
RDW: 12.3 % (ref 11.7–15.4)
WBC: 7.2 10*3/uL (ref 3.4–10.8)

## 2020-01-12 LAB — T3: T3, Total: 158 ng/dL (ref 71–180)

## 2020-01-12 LAB — LIPID PANEL
Chol/HDL Ratio: 3.5 ratio (ref 0.0–4.4)
Cholesterol, Total: 177 mg/dL (ref 100–199)
HDL: 51 mg/dL (ref >39)
LDL Chol Calc (NIH): 82 mg/dL (ref 0–99)
Triglycerides: 268 mg/dL — ABNORMAL HIGH (ref 0–149)
VLDL Cholesterol Cal: 44 mg/dL — ABNORMAL HIGH (ref 5–40)

## 2020-01-12 LAB — HEMOGLOBIN A1C
Est. average glucose Bld gHb Est-mCnc: 114 mg/dL
Hgb A1c MFr Bld: 5.6 % (ref 4.8–5.6)

## 2020-01-12 LAB — MAGNESIUM: Magnesium: 1.5 mg/dL — ABNORMAL LOW (ref 1.6–2.3)

## 2020-01-12 LAB — VITAMIN D 25 HYDROXY (VIT D DEFICIENCY, FRACTURES): Vit D, 25-Hydroxy: 22.5 ng/mL — ABNORMAL LOW (ref 30.0–100.0)

## 2020-01-12 LAB — TSH+FREE T4
Free T4: 1.23 ng/dL (ref 0.82–1.77)
TSH: 3.2 u[IU]/mL (ref 0.450–4.500)

## 2020-01-14 ENCOUNTER — Other Ambulatory Visit: Payer: Self-pay

## 2020-01-14 ENCOUNTER — Encounter: Payer: Self-pay | Admitting: Physician Assistant

## 2020-01-14 ENCOUNTER — Ambulatory Visit (INDEPENDENT_AMBULATORY_CARE_PROVIDER_SITE_OTHER): Payer: PRIVATE HEALTH INSURANCE | Admitting: Physician Assistant

## 2020-01-14 VITALS — BP 133/90 | HR 98 | Temp 97.8°F | Ht 64.0 in | Wt 150.0 lb

## 2020-01-14 DIAGNOSIS — F410 Panic disorder [episodic paroxysmal anxiety] without agoraphobia: Secondary | ICD-10-CM

## 2020-01-14 DIAGNOSIS — F5102 Adjustment insomnia: Secondary | ICD-10-CM

## 2020-01-14 DIAGNOSIS — E781 Pure hyperglyceridemia: Secondary | ICD-10-CM

## 2020-01-14 DIAGNOSIS — E559 Vitamin D deficiency, unspecified: Secondary | ICD-10-CM

## 2020-01-14 DIAGNOSIS — I10 Essential (primary) hypertension: Secondary | ICD-10-CM

## 2020-01-14 DIAGNOSIS — R748 Abnormal levels of other serum enzymes: Secondary | ICD-10-CM

## 2020-01-14 DIAGNOSIS — F411 Generalized anxiety disorder: Secondary | ICD-10-CM

## 2020-01-14 MED ORDER — OLMESARTAN-AMLODIPINE-HCTZ 40-10-12.5 MG PO TABS: 1.0000 | ORAL_TABLET | Freq: Every day | ORAL | 1 refills | Status: DC

## 2020-01-14 NOTE — Progress Notes (Addendum)
Telehealth office visit note for Christina Reid, PA-C- at [facility]   I connected with current patient today by telephone and verified that I am speaking with the correct person   . Location of the patient: Home . Location of the provider: Office - This visit type was conducted due to national recommendations for restrictions regarding the COVID-19 Pandemic (e.g. social distancing) in an effort to limit this patient's exposure and mitigate transmission in our community.    - No physical exam could be performed with this format, beyond that communicated to [place] by the patient/ family members as noted.   - Additionally my office staff/ schedulers were to discuss with the patient that there may be a monetary charge related to this service, depending on their medical insurance.  My understanding is that patient understood and consented to proceed.     _________________________________________________________________________________   History of Present Illness: Pt calls in to discuss lab results and follow-up on HTN and mood management. Pt reports she hasn't noticed a change on her mood with new medications- Zoloft and Buspar that were started 12/14/19. Pt states she's not able to pursue [facility] referral due to work. States her insomnia has improved and probably due to Trazodone helping. She checks her BP twice daily and readings still elevated, range 140s-150s/90s-100s. States highest reading has been 158/108 and today's reading is the best one yet. She reports compliance with medication and denies side effects. Denies chest pain, palpitations, dizziness or lower extremity swelling.    GAD 7 : Generalized Anxiety Score 01/14/2020 12/14/2019 11/13/2017  Nervous, Anxious, on Edge 1 1 1   Control/stop worrying 1 2 0  Worry too much - different things 1 1 0  Trouble relaxing 0 0 0  Restless 0 0 0  Easily annoyed or irritable 0 0 0  Afraid - awful might happen 0 1 0  Total GAD 7 Score 3 5  1   Anxiety Difficulty Not difficult at all Not difficult at all Not difficult at all    Depression screen [facility] 2/9 01/14/2020 12/14/2019 11/15/2019 09/29/2018 01/29/2018  Decreased Interest 0 0 0 0 0  Down, Depressed, Hopeless 0 0 0 0 0  PHQ - 2 Score 0 0 0 0 0  Altered sleeping 0 2 3 2  0  Tired, decreased energy 1 1 0 0 0  Change in appetite 0 0 0 0 0  Feeling bad or failure about yourself  0 0 0 0 0  Trouble concentrating 0 0 0 0 0  Moving slowly or fidgety/restless 0 0 0 0 0  Suicidal thoughts 0 0 0 0 0  PHQ-9 Score 1 3 3 2  0  Difficult doing work/chores Not difficult at all Not difficult at all Not difficult at all - Not difficult at all      Impression and Recommendations:     1. GAD (generalized anxiety disorder)   2. Essential hypertension- poorly controlled   3. Elevated liver enzymes   4. Vitamin D deficiency   5. Hypertriglyceridemia   6. Insomnia due to stress   7. Panic anxiety syndrome     GAD, Panic anxiety syndrome: - Mood stable, GAD score of 7 and PHQ9 score of 1 today. - Discussed with patient efficacy of medications can take 4-6 weeks. Per chart review, pt went to ED for panic attack on 12/16/19 and had not started taking medications yet. - Continue Zoloft 25 mg and Buspar 5 mg. - Follow-up in 3 months to  monitor symptoms and re-evaluate med therapy. Will consider making medication adjustments if no significant improvement.  HTN: - Pt's BP today 133/90 and pulse 98, not at goal of <130/80. - Pt's ambulatory BP monitoring continues to range above goal, so will increase Olmesartan-amlodipine-HCTZ to 40-10-12.5 mg once daily. - Continue ambulatory BP and HR monitoring and keep a log to bring at next OV. - Encourage DASH diet and increase physical activity as manageable.   Hypertriglyceridemia: - Lipid panel stable - Encouraged heart healthy diet and exercise  Elevated Liver Enzymes: - Last AST 57 and ALT 37, improved from 1 year ago. - Pt denies excessive  use of Tylenol and reports she has about 7 drinks/wk. - Continue to avoid acetaminophen products. - Advised to decrease alcohol consumption and ordered hepatitis panel for further evaluation - Will continue to monitor.  Insomnia:  - Sleep is better. - Will continue to monitor.   Vitamin D deficiency: - Vitamin D: 22.5 and patient hasn't filled Vit D rx from 12/14/19 so advised to get it filled and take as directed.  - Will continue to monitor.  - As part of my medical decision making, I reviewed the following data within the electronic MEDICAL RECORD NUMBER History obtained from pt /family, CMA notes reviewed and incorporated if applicable, Labs reviewed, Radiograph/ tests reviewed if applicable and OV notes from prior OV's with me, as well as any other specialists she/he has seen since seeing me last, were all reviewed and used in my medical decision making process today.    - Additionally, when appropriate, discussion had with patient regarding our treatment plan, and their biases/concerns about that plan were used in my medical decision making today.    - The patient agreed with the plan and demonstrated an understanding of the instructions.   No barriers to understanding were identified.     - The patient was advised to call back or seek an in-person evaluation if the symptoms worsen or if the condition fails to improve as anticipated.   Return in about 3 months (around 04/14/2020) for HTN and mood management .    No orders of the defined types were placed in this encounter.   No orders of the defined types were placed in this encounter.   Medications Discontinued During This Encounter  Medication Reason  . traZODone (DESYREL) 100 MG tablet No longer needed (for PRN medications)  . Olmesartan-amLODIPine-HCTZ 20-5-12.5 MG TABS Dose change       Time spent on visit including pre-visit chart review and post-visit care was 23 minutes.      The 21st Century Cures Act was signed  into law in 2016 which includes the topic of electronic health records.  This provides immediate access to information in MyChart.  This includes consultation notes, operative notes, office notes, lab results and pathology reports.  If you have any questions about what you read please let us know at your next visit or call us at the office.  We are right here with you.   __________________________________________________________________________________     Patient Care Team    Relationship Specialty Notifications Start End  Thomasene Lot, DO PCP - General Family Medicine  11/13/17      -Vitals obtained; medications/ allergies reconciled;  personal medical, social, Sx etc.histories were updated by CMA, reviewed by me and are reflected in chart   Patient Active Problem List   Diagnosis Date Noted  . Panic anxiety syndrome 12/14/2019  . Chronic pansinusitis 11/15/2019  . Environmental  and seasonal allergies 11/15/2019  . Influenza 11/24/2018  . Cough in adult patient 11/24/2018  . 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 Elevated antinuclear antibody (ANA) level 09/29/2018  . Pain in both knees 09/29/2018  . Elevated liver enzymes 09/29/2018  . Excessive drinking alcohol 09/29/2018  . 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 GAD (generalized anxiety disorder) 01/29/2018  . Tobacco use disorder 01/29/2018  . Tobacco abuse counseling 01/29/2018  . Depression, recurrent (HCC) 01/29/2018  . Adjustment disorder 11/13/2017  . Vitamin D deficiency 11/13/2017  . Mixed hyperlipidemia 11/13/2017  . Hypertriglyceridemia 11/13/2017  . Poor diet 11/13/2017  . Inactivity 11/13/2017  . Dysmenorrhea, unspecified 11/13/2017  . Hypertension 10/23/2017  . History of depression 10/23/2017  . Stress at work 10/23/2017  . Insomnia due to stress 10/23/2017     Current Meds  Medication Sig  . azelastine (ASTELIN) 0.1 % nasal spray Place 1 spray into both nostrils 2 (two) times daily. 1 spray each  nostril twice daily after sinus rinses  . busPIRone (BUSPAR) 5 MG tablet 1 p.o. 3 times daily for 2 weeks then 2 p.o. 3 times daily  . colchicine 0.6 MG tablet Take 1 tablet (0.6 mg total) by mouth 2 (two) times daily. Start within 24hrs of gouty flair; take until jt pain improves or Diarhea  . fluticasone (FLONASE) 50 MCG/ACT nasal spray 1 spray each nostril following NeilMed or AYR sinus rinses every 12 hours  . levocetirizine (XYZAL) 5 MG tablet Take 1 tablet (5 mg total) by mouth every evening.  Marland Kitchen LORazepam (ATIVAN) 0.5 MG tablet Take 1 tablet (0.5 mg total) by mouth every 8 (eight) hours as needed for anxiety.  . propranolol (INDERAL) 10 MG tablet Take 1 tablet (10 mg total) by mouth 3 (three) times daily. For elevated HR and BP due to stress response  . sertraline (ZOLOFT) 25 MG tablet Take 1 tablet (25 mg total) by mouth daily.  . [DISCONTINUED] Olmesartan-amLODIPine-HCTZ 20-5-12.5 MG TABS 1 po qd     Allergies:  No Known Allergies   ROS:  See above HPI for pertinent positives and negatives   Objective:   Blood pressure 133/90, pulse 98, temperature 97.8 F (36.6 C), temperature source Temporal, height 5\' 4"  (1.626 m), weight 150 lb (68 kg), last menstrual period 01/07/2020.  (if some vitals are omitted, this means that patient was UNABLE to obtain them even though they were asked to get them prior to OV today.  They were asked to call 01/09/2020 at their earliest convenience with these once obtained. ) General: A & O * 3; sounds in no acute distress; in usual state of health.  Skin: Pt confirms warm and dry extremities and pink fingertips HEENT: Pt confirms lips non-cyanotic Chest: Patient confirms normal chest excursion and movement Respiratory: speaking in full sentences, no conversational dyspnea; patient confirms no use of accessory muscles Psych: insight appears good, mood- appears stable

## 2020-01-14 NOTE — Patient Instructions (Signed)
 Generalized Anxiety Disorder, Adult  Generalized anxiety disorder (GAD) is a mental health disorder. People with this condition constantly worry about everyday events. Unlike normal anxiety, worry related to GAD is not triggered by a specific event. These worries also do not fade or get better with time. GAD interferes with life functions, including relationships, work, and school. GAD can vary from mild to severe. People with severe GAD can have intense waves of anxiety with physical symptoms (panic attacks). What are the causes? The exact cause of GAD is not known. What increases the risk? This condition is more likely to develop in:  Women.  People who have a family history of anxiety disorders.  People who are very shy.  People who experience very stressful life events, such as the death of a loved one.  People who have a very stressful family environment.  What are the signs or symptoms? People with GAD often worry excessively about many things in their lives, such as their health and family. They may also be overly concerned about:  Doing well at work.  Being on time.  Natural disasters.  Friendships.  Physical symptoms of GAD include:  Fatigue.  Muscle tension or having muscle twitches.  Trembling or feeling shaky.  Being easily startled.  Feeling like your heart is pounding or racing.  Feeling out of breath or like you cannot take a deep breath.  Having trouble falling asleep or staying asleep.  Sweating.  Nausea, diarrhea, or irritable bowel syndrome (IBS).  Headaches.  Trouble concentrating or remembering facts.  Restlessness.  Irritability.  How is this diagnosed? Your health care provider can diagnose GAD based on your symptoms and medical history. You will also have a physical exam. The health care provider will ask specific questions about your symptoms, including how severe they are, when they started, and if they come and go. Your health  care provider may ask you about your use of alcohol or drugs, including prescription medicines. Your health care provider may refer you to a mental health specialist for further evaluation. Your health care provider will do a thorough examination and may perform additional tests to rule out other possible causes of your symptoms. To be diagnosed with GAD, a person must have anxiety that:  Is out of his or her control.  Affects several different aspects of his or her life, such as work and relationships.  Causes distress that makes him or her unable to take part in normal activities.  Includes at least three physical symptoms of GAD, such as restlessness, fatigue, trouble concentrating, irritability, muscle tension, or sleep problems.  Before your health care provider can confirm a diagnosis of GAD, these symptoms must be present more days than they are not, and they must last for six months or longer. How is this treated? The following therapies are usually used to treat GAD:  Medicine. Antidepressant medicine is usually prescribed for long-term daily control. Antianxiety medicines may be added in severe cases, especially when panic attacks occur.  Talk therapy (psychotherapy). Certain types of talk therapy can be helpful in treating GAD by providing support, education, and guidance. Options include: ? Cognitive behavioral therapy (CBT). People learn coping skills and techniques to ease their anxiety. They learn to identify unrealistic or negative thoughts and behaviors and to replace them with positive ones. ? Acceptance and commitment therapy (ACT). This treatment teaches people how to be mindful as a way to cope with unwanted thoughts and feelings. ? Biofeedback. This process trains   you to manage your body's response (physiological response) through breathing techniques and relaxation methods. You will work with a therapist while machines are used to monitor your physical symptoms.  Stress  management techniques. These include yoga, meditation, and exercise.  A mental health specialist can help determine which treatment is best for you. Some people see improvement with one type of therapy. However, other people require a combination of therapies. Follow these instructions at home:  Take over-the-counter and prescription medicines only as told by your health care provider.  Try to maintain a normal routine.  Try to anticipate stressful situations and allow extra time to manage them.  Practice any stress management or self-calming techniques as taught by your health care provider.  Do not punish yourself for setbacks or for not making progress.  Try to recognize your accomplishments, even if they are small.  Keep all follow-up visits as told by your health care provider. This is important. Contact a health care provider if:  Your symptoms do not get better.  Your symptoms get worse.  You have signs of depression, such as: ? A persistently sad, cranky, or irritable mood. ? Loss of enjoyment in activities that used to bring you joy. ? Change in weight or eating. ? Changes in sleeping habits. ? Avoiding friends or family members. ? Loss of energy for normal tasks. ? Feelings of guilt or worthlessness. Get help right away if:  You have serious thoughts about hurting yourself or others. If you ever feel like you may hurt yourself or others, or have thoughts about taking your own life, get help right away. You can go to your nearest emergency department or call:  Your local emergency services (911 in the [place]  A suicide crisis helpline, such as the National Suicide Prevention Lifeline at 1-800-273-8255. This is open 24 hours a day.  Summary  Generalized anxiety disorder (GAD) is a mental health disorder that involves worry that is not triggered by a specific event.  People with GAD often worry excessively about many things in their lives, such as their health and  family.  GAD may cause physical symptoms such as restlessness, trouble concentrating, sleep problems, frequent sweating, nausea, diarrhea, headaches, and trembling or muscle twitching.  A mental health specialist can help determine which treatment is best for you. Some people see improvement with one type of therapy. However, other people require a combination of therapies. This information is not intended to replace advice given to you by your health care provider. Make sure you discuss any questions you have with your health care provider. Document Released: 12/28/2012 Document Revised: 07/23/2016 Document Reviewed: 07/23/2016 Elsevier Interactive Patient Education  2018 Elsevier Inc.      Living With Anxiety  After being diagnosed with an anxiety disorder, you may be relieved to know why you have felt or behaved a certain way. It is natural to also feel overwhelmed about the treatment ahead and what it will mean for your life. With care and support, you can manage this condition and recover from it. How to cope with anxiety Dealing with stress Stress is your body's reaction to life changes and events, both good and bad. Stress can last just a few hours or it can be ongoing. Stress can play a major role in anxiety, so it is important to learn both how to cope with stress and how to think about it differently. Talk with your health care provider or a counselor to learn more about stress reduction. He   or she may suggest some stress reduction techniques, such as:  Music therapy. This can include creating or listening to music that you enjoy and that inspires you.  Mindfulness-based meditation. This involves being aware of your normal breaths, rather than trying to control your breathing. It can be done while sitting or walking.  Centering prayer. This is a kind of meditation that involves focusing on a word, phrase, or sacred image that is meaningful to you and that brings you peace.  Deep  breathing. To do this, expand your stomach and inhale slowly through your nose. Hold your breath for 3-5 seconds. Then exhale slowly, allowing your stomach muscles to relax.  Self-talk. This is a skill where you identify thought patterns that lead to anxiety reactions and correct those thoughts.  Muscle relaxation. This involves tensing muscles then relaxing them.  Choose a stress reduction technique that fits your lifestyle and personality. Stress reduction techniques take time and practice. Set aside 5-15 minutes a day to do them. Therapists can offer training in these techniques. The training may be covered by some insurance plans. Other things you can do to manage stress include:  Keeping a stress diary. This can help you learn what triggers your stress and ways to control your response.  Thinking about how you respond to certain situations. You may not be able to control everything, but you can control your reaction.  Making time for activities that help you relax, and not feeling guilty about spending your time in this way.  Therapy combined with coping and stress-reduction skills provides the best chance for successful treatment. Medicines Medicines can help ease symptoms. Medicines for anxiety include:  Anti-anxiety drugs.  Antidepressants.  Beta-blockers.  Medicines may be used as the main treatment for anxiety disorder, along with therapy, or if other treatments are not working. Medicines should be prescribed by a health care provider. Relationships Relationships can play a big part in helping you recover. Try to spend more time connecting with trusted friends and family members. Consider going to couples counseling, taking family education classes, or going to family therapy. Therapy can help you and others better understand the condition. How to recognize changes in your condition Everyone has a different response to treatment for anxiety. Recovery from anxiety happens when  symptoms decrease and stop interfering with your daily activities at home or work. This may mean that you will start to:  Have better concentration and focus.  Sleep better.  Be less irritable.  Have more energy.  Have improved memory.  It is important to recognize when your condition is getting worse. Contact your health care provider if your symptoms interfere with home or work and you do not feel like your condition is improving. Where to find help and support: You can get help and support from these sources:  Self-help groups.  Online and community organizations.  A trusted spiritual leader.  Couples counseling.  Family education classes.  Family therapy.  Follow these instructions at home:  Eat a healthy diet that includes plenty of vegetables, fruits, whole grains, low-fat dairy products, and lean protein. Do not eat a lot of foods that are high in solid fats, added sugars, or salt.  Exercise. Most adults should do the following: ? Exercise for at least 150 minutes each week. The exercise should increase your heart rate and make you sweat (moderate-intensity exercise). ? Strengthening exercises at least twice a week.  Cut down on caffeine, tobacco, alcohol, and other potentially harmful substances.    Get the right amount and quality of sleep. Most adults need 7-9 hours of sleep each night.  Make choices that simplify your life.  Take over-the-counter and prescription medicines only as told by your health care provider.  Avoid caffeine, alcohol, and certain over-the-counter cold medicines. These may make you feel worse. Ask your pharmacist which medicines to avoid.  Keep all follow-up visits as told by your health care provider. This is important. Questions to ask your health care provider  Would I benefit from therapy?  How often should I follow up with a health care provider?  How long do I need to take medicine?  Are there any long-term side effects of my  medicine?  Are there any alternatives to taking medicine? Contact a health care provider if:  You have a hard time staying focused or finishing daily tasks.  You spend many hours a day feeling worried about everyday life.  You become exhausted by worry.  You start to have headaches, feel tense, or have nausea.  You urinate more than normal.  You have diarrhea. Get help right away if:  You have a racing heart and shortness of breath.  You have thoughts of hurting yourself or others. If you ever feel like you may hurt yourself or others, or have thoughts about taking your own life, get help right away. You can go to your nearest emergency department or call:  Your local emergency services (911 in the [place]  A suicide crisis helpline, such as the National Suicide Prevention Lifeline at 1-800-273-8255. This is open 24-hours a day.  Summary  Taking steps to deal with stress can help calm you.  Medicines cannot cure anxiety disorders, but they can help ease symptoms.  Family, friends, and partners can play a big part in helping you recover from an anxiety disorder. This information is not intended to replace advice given to you by your health care provider. Make sure you discuss any questions you have with your health care provider. Document Released: 08/27/2016 Document Revised: 08/27/2016 Document Reviewed: 08/27/2016 Elsevier Interactive Patient Education  2018 Elsevier Inc.   

## 2020-01-14 NOTE — Progress Notes (Signed)
[employer] and spoke with [place].  Hepatitis panel added to labs.  Tiajuana Amass, CMA

## 2020-01-25 LAB — HEPATITIS PANEL, ACUTE
Hep A IgM: NEGATIVE
Hep B C IgM: NEGATIVE
Hep C Virus Ab: <0.1 s/co ratio (ref 0.0–0.9)
Hepatitis B Surface Ag: NEGATIVE

## 2020-01-25 LAB — SPECIMEN STATUS REPORT

## 2020-01-27 ENCOUNTER — Encounter: Payer: Self-pay | Admitting: Physician Assistant

## 2020-02-24 ENCOUNTER — Telehealth: Payer: Self-pay | Admitting: Physician Assistant

## 2020-02-24 NOTE — Telephone Encounter (Signed)
Patient called states swelling in both legs last 2 weeks , unsure if it's side effects of Rx or if something else going on but was seeking an OV( unfortunately none available for the next 2 weeks--Place on call list & Forwarding message to med asst to review w/provider & call pt back with instructions/ advice @ [phone].  --Christina Krueger

## 2020-02-24 NOTE — Telephone Encounter (Signed)
Spoke with patient who states swelling of her legs and feet x 1 week. Patient states that yesterday evening and today are the worse. Patient states her "skin is so tight it feels like its going to rip" and that she "pressed a finger into my leg last night and it left my finger print indention".   I advised patient that she would need to be seen and evaluated and that unfortunately at this time we didn't have any available appointments. I suggested patient to go UC for evaluation and treatment.   Patient verbalized understanding. AS, CMA

## 2020-03-07 ENCOUNTER — Other Ambulatory Visit: Payer: Self-pay | Admitting: Family Medicine

## 2020-03-07 DIAGNOSIS — F411 Generalized anxiety disorder: Secondary | ICD-10-CM

## 2020-03-07 MED ORDER — SERTRALINE HCL 25 MG PO TABS: 25.0000 mg | ORAL_TABLET | Freq: Every day | ORAL | 0 refills | Status: DC

## 2020-04-12 ENCOUNTER — Ambulatory Visit
Admission: EM | Admit: 2020-04-12 | Discharge: 2020-04-12 | Disposition: A | Discharge status: Home / Self Care | Payer: 59 | Attending: Physician Assistant | Admitting: Physician Assistant

## 2020-04-12 ENCOUNTER — Other Ambulatory Visit: Payer: Self-pay

## 2020-04-12 DIAGNOSIS — R0981 Nasal congestion: Secondary | ICD-10-CM

## 2020-04-12 DIAGNOSIS — J3489 Other specified disorders of nose and nasal sinuses: Secondary | ICD-10-CM

## 2020-04-12 MED ORDER — KETOROLAC TROMETHAMINE 15 MG/ML IJ SOLN
15.0000 mg | Freq: Once | INTRAMUSCULAR | Status: AC
  Administered 2020-04-12: 15 mg via INTRAMUSCULAR

## 2020-04-12 MED ORDER — METOCLOPRAMIDE HCL 5 MG/ML IJ SOLN
5.0000 mg | Freq: Once | INTRAMUSCULAR | Status: AC
  Administered 2020-04-12: 5 mg via INTRAMUSCULAR

## 2020-04-12 MED ORDER — PREDNISONE 50 MG PO TABS: 50.0000 mg | ORAL_TABLET | Freq: Every day | ORAL | 0 refills | Status: DC

## 2020-04-12 MED ORDER — FLUTICASONE PROPIONATE 50 MCG/ACT NA SUSP: 2.0000 | Freq: Every day | NASAL | 0 refills | Status: DC

## 2020-04-12 MED ORDER — DEXAMETHASONE SODIUM PHOSPHATE 10 MG/ML IJ SOLN
10.0000 mg | Freq: Once | INTRAMUSCULAR | Status: AC
  Administered 2020-04-12: 10 mg via INTRAMUSCULAR

## 2020-04-12 MED ORDER — AZELASTINE HCL 0.1 % NA SOLN: 2.0000 | Freq: Two times a day (BID) | NASAL | 0 refills | Status: DC

## 2020-04-12 NOTE — ED Triage Notes (Signed)
Pt c/o severe headache x3 days. States if she bends her head down she feels it in her throat. If she leans her head back she becomes dizzy and nauseous. Pt c/o fatigue and some nasal congestion. Denies covid vaccine.

## 2020-04-12 NOTE — ED Provider Notes (Signed)
[facility]    CSN: [id number] Arrival date & time: 04/12/20  1151      History   Chief Complaint Chief Complaint  Patient presents with  . Headache    HPI Christina Krueger is a 39 y.o. female.   39 year old female comes in for 3 day history of frontal headache, nasal congestion, rhinorrhea, body aches. States headache has been constant, worse with head movement, which can cause some nausea/dizziness. Denies fever. Denies vomiting. Denies shortness of breath, loss of taste/smell. No COVID vaccine.     Past Medical History:  Diagnosis Date  . Hypertension     Patient Active Problem List   Diagnosis Date Noted  . Panic anxiety syndrome 12/14/2019  . Chronic pansinusitis 11/15/2019  . Environmental and seasonal allergies 11/15/2019  . Influenza 11/24/2018  . Cough in adult patient 11/24/2018  . 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 Elevated antinuclear antibody (ANA) level 09/29/2018  . Pain in both knees 09/29/2018  . Elevated liver enzymes 09/29/2018  . Excessive drinking alcohol 09/29/2018  . 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 GAD (generalized anxiety disorder) 01/29/2018  . Tobacco use disorder 01/29/2018  . Tobacco abuse counseling 01/29/2018  . Depression, recurrent (HCC) 01/29/2018  . Adjustment disorder 11/13/2017  . Vitamin D deficiency 11/13/2017  . Mixed hyperlipidemia 11/13/2017  . Hypertriglyceridemia 11/13/2017  . Poor diet 11/13/2017  . Inactivity 11/13/2017  . Dysmenorrhea, unspecified 11/13/2017  . Hypertension 10/23/2017  . History of depression 10/23/2017  . Stress at work 10/23/2017  . Insomnia due to stress 10/23/2017    Past Surgical History:  Procedure Laterality Date  . TUBAL LIGATION      OB History   No obstetric history on file.      Home Medications    Prior to Admission medications   Medication Sig Start Date End Date Taking? Authorizing Provider  azelastine (ASTELIN) 0.1 % nasal spray Place 2 sprays  into both nostrils 2 (two) times daily. 04/12/20   Cathie Hoops, Shakenya Stoneberg V, PA-C  fluticasone (FLONASE) 50 MCG/ACT nasal spray Place 2 sprays into both nostrils daily. 04/12/20   Cathie Hoops, Jozalyn Baglio V, PA-C  LORazepam (ATIVAN) 0.5 MG tablet Take 1 tablet (0.5 mg total) by mouth every 8 (eight) hours as needed for anxiety. 12/16/19 12/15/20  Emily Filbert, MD  Olmesartan-amLODIPine-HCTZ 40-10-12.5 MG TABS Take 1 tablet by mouth daily. 01/14/20   Mayer Masker, PA-C  predniSONE (DELTASONE) 50 MG tablet Take 1 tablet (50 mg total) by mouth daily with breakfast. 04/12/20   Cathie Hoops, Kaelyn Nauta V, PA-C  sertraline (ZOLOFT) 25 MG tablet Take 1 tablet (25 mg total) by mouth daily. **PATIENT NEEDS APT FOR FURTHER REFILLS** 03/07/20   Mayer Masker, PA-C  Vitamin D, Ergocalciferol, (DRISDOL) 1.25 MG (50000 UNIT) CAPS capsule Take 1 capsule Wednesdays and Sundays. Patient not taking: Reported on 01/14/2020 12/14/19   Thomasene Lot, DO    Family History Family History  Problem Relation Age of Onset  . Hypertension Mother   . Hypertension Father     Social History Social History   Tobacco Use  . Smoking status: Current Every Day Smoker    Packs/day: 0.25    Types: Cigarettes  . Smokeless tobacco: Never Used  Vaping Use  . Vaping Use: Never used  Substance Use Topics  . Alcohol use: Yes    Alcohol/week: 12.0 standard drinks    Types: 12 Standard drinks or equivalent per week  . Drug use: No     Allergies   Patient  has no known allergies.   Review of Systems Review of Systems  Reason unable to perform ROS: See HPI as above.     Physical Exam Triage Vital Signs ED Triage Vitals [04/12/20 1310]  Enc Vitals Group     BP (!) 158/89     Pulse Rate 89     Resp 18     Temp 98.3 F (36.8 C)     Temp Source Oral     SpO2 99 %     Weight      Height      Head Circumference      Peak Flow      Pain Score 10     Pain Loc      Pain Edu?      Excl. in GC?    No data found.  Updated Vital Signs BP (!) 158/89 (BP  Location: Left Arm)   Pulse 89   Temp 98.3 F (36.8 C) (Oral)   Resp 18   LMP 03/07/2020   SpO2 99%   Visual Acuity Right Eye Distance:   Left Eye Distance:   Bilateral Distance:    Right Eye Near:   Left Eye Near:    Bilateral Near:     Physical Exam Constitutional:      General: She is not in acute distress.    Appearance: Normal appearance. She is well-developed. She is not ill-appearing, toxic-appearing or diaphoretic.  HENT:     Head: Normocephalic and atraumatic.     Right Ear: Tympanic membrane, ear canal and external ear normal. Tympanic membrane is not erythematous or bulging.     Left Ear: Tympanic membrane, ear canal and external ear normal. Tympanic membrane is not erythematous or bulging.     Nose:     Right Sinus: Maxillary sinus tenderness and frontal sinus tenderness present.     Left Sinus: Maxillary sinus tenderness and frontal sinus tenderness present.     Mouth/Throat:     Mouth: Mucous membranes are moist.     Pharynx: Oropharynx is clear. Uvula midline.  Eyes:     Extraocular Movements: Extraocular movements intact.     Conjunctiva/sclera: Conjunctivae normal.     Pupils: Pupils are equal, round, and reactive to light.  Cardiovascular:     Rate and Rhythm: Normal rate and regular rhythm.  Pulmonary:     Effort: Pulmonary effort is normal. No accessory muscle usage, prolonged expiration, respiratory distress or retractions.     Breath sounds: No decreased air movement or transmitted upper airway sounds. No decreased breath sounds.     Comments: LCTAB Musculoskeletal:     Cervical back: Normal range of motion and neck supple.  Skin:    General: Skin is warm and dry.  Neurological:     Mental Status: She is alert and oriented to person, place, and time.     Comments: Able to ambulate on own.       UC Treatments / Results  Labs (all labs ordered are listed, but only abnormal results are displayed) Labs Reviewed  NOVEL CORONAVIRUS, NAA     EKG   Radiology No results found.  Procedures Procedures (including critical care time)  Medications Ordered in UC Medications  ketorolac (TORADOL) 15 MG/ML injection 15 mg (15 mg Intramuscular Given 04/12/20 1352)  metoCLOPramide (REGLAN) injection 5 mg (5 mg Intramuscular Given 04/12/20 1354)  dexamethasone (DECADRON) injection 10 mg (10 mg Intramuscular Given 04/12/20 1353)    Initial Impression / Assessment and Plan /  UC Course  I have reviewed the triage vital signs and the nursing notes.  Pertinent labs & imaging results that were available during my care of the patient were reviewed by me and considered in my medical decision making (see chart for details).    COVID PCR test ordered. Patient to quarantine until testing results return. Will provide toradol, decadron, reglan injection for headache. Prednisone and other symptomatic treatment discussed.   Push fluids.  Return precautions given.  Patient expresses understanding and agrees to plan.  Final Clinical Impressions(s) / UC Diagnoses   Final diagnoses:  Sinus pressure  Nasal congestion  Rhinorrhea    ED Prescriptions    Medication Sig Dispense Auth. Provider   predniSONE (DELTASONE) 50 MG tablet Take 1 tablet (50 mg total) by mouth daily with breakfast. 5 tablet Mendy Lapinsky V, PA-C   fluticasone (FLONASE) 50 MCG/ACT nasal spray Place 2 sprays into both nostrils daily. 1 g Avigail Pilling V, PA-C   azelastine (ASTELIN) 0.1 % nasal spray Place 2 sprays into both nostrils 2 (two) times daily. 30 mL Belinda Fisher, PA-C     PDMP not reviewed this encounter.   Belinda Fisher, PA-C 04/12/20 1409

## 2020-04-12 NOTE — Discharge Instructions (Addendum)
COVID PCR testing ordered. I would like you to quarantine until testing results. Toradol, reglan, decadron injection in office today. Prednisone as directed. Flonase and azelastine for nasal congestion/drainage. Tylenol/motrin for pain and fever. Keep hydrated, urine should be clear to pale yellow in color. If experiencing shortness of breath, trouble breathing, go to the emergency department for further evaluation needed.

## 2020-04-13 ENCOUNTER — Encounter: Payer: Self-pay | Admitting: Physician Assistant

## 2020-04-13 LAB — NOVEL CORONAVIRUS, NAA: SARS-CoV-2, NAA: NOT DETECTED

## 2020-04-13 LAB — SARS-COV-2, NAA 2 DAY TAT

## 2020-04-17 ENCOUNTER — Encounter: Payer: Self-pay | Admitting: Physician Assistant

## 2020-04-17 DIAGNOSIS — J019 Acute sinusitis, unspecified: Secondary | ICD-10-CM

## 2020-04-17 MED ORDER — AMOXICILLIN-POT CLAVULANATE 875-125 MG PO TABS: 1.0000 | ORAL_TABLET | Freq: Two times a day (BID) | ORAL | 0 refills | Status: DC

## 2020-04-28 ENCOUNTER — Other Ambulatory Visit: Payer: Self-pay | Admitting: Family Medicine

## 2020-04-30 ENCOUNTER — Encounter: Payer: Self-pay | Admitting: Physician Assistant

## 2020-05-31 ENCOUNTER — Telehealth: Payer: Self-pay | Admitting: Physician Assistant

## 2020-05-31 DIAGNOSIS — F411 Generalized anxiety disorder: Secondary | ICD-10-CM

## 2020-05-31 MED ORDER — SERTRALINE HCL 25 MG PO TABS: 25.0000 mg | ORAL_TABLET | Freq: Every day | ORAL | 0 refills | Status: DC

## 2020-05-31 NOTE — Telephone Encounter (Signed)
30 day supply of Zoloft sent to requested pharmacy per refill protocol.   I do not see that patient was given prednisone by [place] before for Gout. Please review and advise. AS, CMA

## 2020-05-31 NOTE — Telephone Encounter (Signed)
Patient realizes she is overdue for a med f/u for refills. She has scheduled our first available but is was not until 10/12 which she scheduled. She is currently out of her prednisone ans sertraline and is hoping for a refill. If approved please send to [facility] on [address] in [place]

## 2020-05-31 NOTE — Telephone Encounter (Signed)
Patient advised of the below and verbalized understanding. She was very upset that we would not treat her for gout without an appointment. AS, CMA

## 2020-05-31 NOTE — Addendum Note (Signed)
Addended by: Sylvester Harder on: 05/31/2020 03:00 PM   Modules accepted: Orders

## 2020-05-31 NOTE — Telephone Encounter (Signed)
Patient's last visit for gout management was 09/2018 with Dr. Sharee Holster and hasn't been addressed since then. Maintenance labs are needed for medication management. Prednisone is usually used for acute flare-ups and if patient is having a gout flare-up she needs to be seen for evaluation.  Thank you, Kandis Cocking

## 2020-06-27 ENCOUNTER — Ambulatory Visit: Payer: PRIVATE HEALTH INSURANCE | Admitting: Physician Assistant

## 2020-07-18 ENCOUNTER — Encounter: Payer: Self-pay | Admitting: Physician Assistant

## 2020-07-18 ENCOUNTER — Other Ambulatory Visit: Payer: Self-pay

## 2020-07-18 ENCOUNTER — Ambulatory Visit (INDEPENDENT_AMBULATORY_CARE_PROVIDER_SITE_OTHER): Payer: PRIVATE HEALTH INSURANCE | Admitting: Physician Assistant

## 2020-07-18 VITALS — BP 103/67 | HR 82 | Temp 98.3°F | Ht 64.0 in | Wt 159.9 lb

## 2020-07-18 DIAGNOSIS — Z8739 Personal history of other diseases of the musculoskeletal system and connective tissue: Secondary | ICD-10-CM | POA: Diagnosis not present

## 2020-07-18 DIAGNOSIS — F411 Generalized anxiety disorder: Secondary | ICD-10-CM | POA: Diagnosis not present

## 2020-07-18 DIAGNOSIS — I10 Essential (primary) hypertension: Secondary | ICD-10-CM | POA: Diagnosis not present

## 2020-07-18 DIAGNOSIS — J324 Chronic pansinusitis: Secondary | ICD-10-CM | POA: Diagnosis not present

## 2020-07-18 DIAGNOSIS — F410 Panic disorder [episodic paroxysmal anxiety] without agoraphobia: Secondary | ICD-10-CM

## 2020-07-18 MED ORDER — FLUTICASONE PROPIONATE 50 MCG/ACT NA SUSP: 2.0000 | Freq: Every day | NASAL | 0 refills | Status: DC

## 2020-07-18 MED ORDER — HYDROXYZINE HCL 50 MG PO TABS: ORAL_TABLET | ORAL | 1 refills | Status: DC

## 2020-07-18 MED ORDER — AMLODIPINE-OLMESARTAN 10-40 MG PO TABS: 1.0000 | ORAL_TABLET | Freq: Every day | ORAL | 0 refills | Status: DC

## 2020-07-18 MED ORDER — SERTRALINE HCL 50 MG PO TABS: 50.0000 mg | ORAL_TABLET | Freq: Every day | ORAL | 0 refills | Status: DC

## 2020-07-18 MED ORDER — HYDROCHLOROTHIAZIDE 12.5 MG PO CAPS: 12.5000 mg | ORAL_CAPSULE | Freq: Every day | ORAL | 0 refills | Status: DC

## 2020-07-18 NOTE — Patient Instructions (Addendum)
Managing Anxiety, Adult After being diagnosed with an anxiety disorder, you may be relieved to know why you have felt or behaved a certain way. You may also feel overwhelmed about the treatment ahead and what it will mean for your life. With care and support, you can manage this condition and recover from it. How to manage lifestyle changes Managing stress and anxiety  Stress is your body's reaction to life changes and events, both good and bad. Most stress will last just a few hours, but stress can be ongoing and can lead to more than just stress. Although stress can play a major role in anxiety, it is not the same as anxiety. Stress is usually caused by something external, such as a deadline, test, or competition. Stress normally passes after the triggering event has ended.  Anxiety is caused by something internal, such as imagining a terrible outcome or worrying that something will go wrong that will devastate you. Anxiety often does not go away even after the triggering event is over, and it can become long-term (chronic) worry. It is important to understand the differences between stress and anxiety and to manage your stress effectively so that it does not lead to an anxious response. Talk with your health care provider or a counselor to learn more about reducing anxiety and stress. He or she may suggest tension reduction techniques, such as:  Music therapy. This can include creating or listening to music that you enjoy and that inspires you.  Mindfulness-based meditation. This involves being aware of your normal breaths while not trying to control your breathing. It can be done while sitting or walking.  Centering prayer. This involves focusing on a word, phrase, or sacred image that means something to you and brings you peace.  Deep breathing. To do this, expand your stomach and inhale slowly through your nose. Hold your breath for 3-5 seconds. Then exhale slowly, letting your stomach muscles  relax.  Self-talk. This involves identifying thought patterns that lead to anxiety reactions and changing those patterns.  Muscle relaxation. This involves tensing muscles and then relaxing them. Choose a tension reduction technique that suits your lifestyle and personality. These techniques take time and practice. Set aside 5-15 minutes a day to do them. Therapists can offer counseling and training in these techniques. The training to help with anxiety may be covered by some insurance plans. Other things you can do to manage stress and anxiety include:  Keeping a stress/anxiety diary. This can help you learn what triggers your reaction and then learn ways to manage your response.  Thinking about how you react to certain situations. You may not be able to control everything, but you can control your response.  Making time for activities that help you relax and not feeling guilty about spending your time in this way.  Visual imagery and yoga can help you stay calm and relax.  Medicines Medicines can help ease symptoms. Medicines for anxiety include:  Anti-anxiety drugs.  Antidepressants. Medicines are often used as a primary treatment for anxiety disorder. Medicines will be prescribed by a health care provider. When used together, medicines, psychotherapy, and tension reduction techniques may be the most effective treatment. Relationships Relationships can play a big part in helping you recover. Try to spend more time connecting with trusted friends and family members. Consider going to couples counseling, taking family education classes, or going to family therapy. Therapy can help you and others better understand your condition. How to recognize changes in your   anxiety Everyone responds differently to treatment for anxiety. Recovery from anxiety happens when symptoms decrease and stop interfering with your daily activities at home or work. This may mean that you will start to:  Have  better concentration and focus. Worry will interfere less in your daily thinking.  Sleep better.  Be less irritable.  Have more energy.  Have improved memory. It is important to recognize when your condition is getting worse. Contact your health care provider if your symptoms interfere with home or work and you feel like your condition is not improving. Follow these instructions at home: Activity  Exercise. Most adults should do the following: ? Exercise for at least 150 minutes each week. The exercise should increase your heart rate and make you sweat (moderate-intensity exercise). ? Strengthening exercises at least twice a week.  Get the right amount and quality of sleep. Most adults need 7-9 hours of sleep each night. Lifestyle   Eat a healthy diet that includes plenty of vegetables, fruits, whole grains, low-fat dairy products, and lean protein. Do not eat a lot of foods that are high in solid fats, added sugars, or salt.  Make choices that simplify your life.  Do not use any products that contain nicotine or tobacco, such as cigarettes, e-cigarettes, and chewing tobacco. If you need help quitting, ask your health care provider.  Avoid caffeine, alcohol, and certain over-the-counter cold medicines. These may make you feel worse. Ask your pharmacist which medicines to avoid. General instructions  Take over-the-counter and prescription medicines only as told by your health care provider.  Keep all follow-up visits as told by your health care provider. This is important. Where to find support You can get help and support from these sources:  Self-help groups.  Online and community organizations.  A trusted spiritual leader.  Couples counseling.  Family education classes.  Family therapy. Where to find more information You may find that joining a support group helps you deal with your anxiety. The following sources can help you locate counselors or support groups near  you:  Mental Health America: www.mentalhealthamerica.net  Anxiety and Depression Association of America (ADAA): www.adaa.org  National Alliance on Mental Illness (NAMI): www.nami.org Contact a health care provider if you:  Have a hard time staying focused or finishing daily tasks.  Spend many hours a day feeling worried about everyday life.  Become exhausted by worry.  Start to have headaches, feel tense, or have nausea.  Urinate more than normal.  Have diarrhea. Get help right away if you have:  A racing heart and shortness of breath.  Thoughts of hurting yourself or others. If you ever feel like you may hurt yourself or others, or have thoughts about taking your own life, get help right away. You can go to your nearest emergency department or call:  Your local emergency services (911 in the [place]  A suicide crisis helpline, such as the National Suicide Prevention Lifeline at 1-800-273-8255. This is open 24 hours a day. Summary  Taking steps to learn and use tension reduction techniques can help calm you and help prevent triggering an anxiety reaction.  When used together, medicines, psychotherapy, and tension reduction techniques may be the most effective treatment.  Family, friends, and partners can play a big part in helping you recover from an anxiety disorder. This information is not intended to replace advice given to you by your health care provider. Make sure you discuss any questions you have with your health care provider. Document Revised:   02/02/2019 Document Reviewed: 02/02/2019 Elsevier Patient Education  2020 [employer] Inc.    Gout  Gout is painful swelling of your joints. Gout is a type of arthritis. It is caused by having too much uric acid in your body. Uric acid is a chemical that is made when your body breaks down substances called purines. If your body has too much uric acid, sharp crystals can form and build up in your joints. This causes pain and  swelling. Gout attacks can happen quickly and be very painful (acute gout). Over time, the attacks can affect more joints and happen more often (chronic gout). What are the causes?  Too much uric acid in your blood. This can happen because: ? Your kidneys do not remove enough uric acid from your blood. ? Your body makes too much uric acid. ? You eat too many foods that are high in purines. These foods include organ meats, some seafood, and beer.  Trauma or stress. What increases the risk?  Having a family history of gout.  Being female and middle-aged.  Being female and having gone through menopause.  Being very overweight (obese).  Drinking alcohol, especially beer.  Not having enough water in the body (being dehydrated).  Losing weight too quickly.  Having an organ transplant.  Having lead poisoning.  Taking certain medicines.  Having kidney disease.  Having a skin condition called psoriasis. What are the signs or symptoms? An attack of acute gout usually happens in just one joint. The most common place is the big toe. Attacks often start at night. Other joints that may be affected include joints of the feet, ankle, knee, fingers, wrist, or elbow. Symptoms of an attack may include:  Very bad pain.  Warmth.  Swelling.  Stiffness.  Shiny, red, or purple skin.  Tenderness. The affected joint may be very painful to touch.  Chills and fever. Chronic gout may cause symptoms more often. More joints may be involved. You may also have white or yellow lumps (tophi) on your hands or feet or in other areas near your joints. How is this treated?  Treatment for this condition has two phases: treating an acute attack and preventing future attacks.  Acute gout treatment may include: ? NSAIDs. ? Steroids. These are taken by mouth or injected into a joint. ? Colchicine. This medicine relieves pain and swelling. It can be given by mouth or through an IV tube.  Preventive  treatment may include: ? Taking small doses of NSAIDs or colchicine daily. ? Using a medicine that reduces uric acid levels in your blood. ? Making changes to your diet. You may need to see a food expert (dietitian) about what to eat and drink to prevent gout. Follow these instructions at home: During a gout attack   If told, put ice on the painful area: ? Put ice in a plastic bag. ? Place a towel between your skin and the bag. ? Leave the ice on for 20 minutes, 2-3 times a day.  Raise (elevate) the painful joint above the level of your heart as often as you can.  Rest the joint as much as possible. If the joint is in your leg, you may be given crutches.  Follow instructions from your doctor about what you cannot eat or drink. Avoiding future gout attacks  Eat a low-purine diet. Avoid foods and drinks such as: ? Liver. ? Kidney. ? Anchovies. ? Asparagus. ? Herring. ? Mushrooms. ? Mussels. ? Beer.  Stay at a healthy  weight. If you want to lose weight, talk with your doctor. Do not lose weight too fast.  Start or continue an exercise plan as told by your doctor. Eating and drinking  Drink enough fluids to keep your pee (urine) pale yellow.  If you drink alcohol: ? Limit how much you use to:  0-1 drink a day for women.  0-2 drinks a day for men. ? Be aware of how much alcohol is in your drink. In the [place] one drink equals one 12 oz bottle of beer (355 mL), one 5 oz glass of wine (148 mL), or one 1 oz glass of hard liquor (44 mL). General instructions  Take over-the-counter and prescription medicines only as told by your doctor.  Do not drive or use heavy machinery while taking prescription pain medicine.  Return to your normal activities as told by your doctor. Ask your doctor what activities are safe for you.  Keep all follow-up visits as told by your doctor. This is important. Contact a doctor if:  You have another gout attack.  You still have symptoms of a  gout attack after 10 days of treatment.  You have problems (side effects) because of your medicines.  You have chills or a fever.  You have burning pain when you pee (urinate).  You have pain in your lower back or belly. Get help right away if:  You have very bad pain.  Your pain cannot be controlled.  You cannot pee. Summary  Gout is painful swelling of the joints.  The most common site of pain is the big toe, but it can affect other joints.  Medicines and avoiding some foods can help to prevent and treat gout attacks. This information is not intended to replace advice given to you by your health care provider. Make sure you discuss any questions you have with your health care provider. Document Revised: 03/25/2018 Document Reviewed: 03/25/2018 Elsevier Patient Education  2020 [employer].

## 2020-07-18 NOTE — Progress Notes (Signed)
Established Patient Office Visit  Subjective:  Patient ID: Christina Krueger, female    DOB: 24-Nov-1980  Age: 39 y.o. MRN: [id number]  CC:  Chief Complaint  Patient presents with  . Depression  . Hypertension  . Gout    HPI Christina Krueger presents for follow up on depression, hypertension and gout. Requesting refill of Flonase which helps with chronic sinusitis. States has tried other medications which have not been as effective as nasal spray.  Mood: States anxiety was initially doing better but the last couple of weeks her anxiety has increased and even had an episode of a panic attack while driving on the highway.  States has never had issues in the past with anxiety and panic attacks and is unsure what is triggering her symptoms. She is afraid of driving on highways and crossing bridges.  HTN: Pt denies chest pain, palpitations, dizziness or lower extremity swelling.Taking medication as directed without side effects. Checks BP at home and readings range 120s/70-80. Pt follows a low salt diet. Reports since starting the increased dose of medication has experienced some back pain. States cannot be in certain positions for a long time because it will flare up her pain.  Has tried ibuprofen and and over-the-counter cream BenGay which provides pain relief.  Gout: Reports a history of gout and states once every couple of months will experience a flareup. Limits seafood which can be a trigger for her symptoms. Reports starts having erythema and swelling of bilateral knees which then progresses down to her ankles. Currently asymptomatic. Unable to recall gout medicine she used to take as needed for exacerbations.   Past Medical History:  Diagnosis Date  . Hypertension     Past Surgical History:  Procedure Laterality Date  . TUBAL LIGATION      Family History  Problem Relation Age of Onset  . Hypertension Mother   . Hypertension Father     Social History   Socioeconomic History   . Marital status: Married    Spouse name: Not on file  . Number of children: Not on file  . Years of education: Not on file  . Highest education level: Not on file  Occupational History  . Not on file  Tobacco Use  . Smoking status: Current Every Day Smoker    Packs/day: 0.25    Types: Cigarettes  . Smokeless tobacco: Never Used  Vaping Use  . Vaping Use: Never used  Substance and Sexual Activity  . Alcohol use: Yes    Alcohol/week: 12.0 standard drinks    Types: 12 Standard drinks or equivalent per week  . Drug use: No  . Sexual activity: Yes    Birth control/protection: None  Other Topics Concern  . Not on file  Social History Narrative  . Not on file   Social Determinants of Health   Financial Resource Strain:   . Difficulty of Paying Living Expenses: Not on file  Food Insecurity:   . Worried About Programme researcher, broadcasting/film/video in the Last Year: Not on file  . Ran Out of Food in the Last Year: Not on file  Transportation Needs:   . Lack of Transportation (Medical): Not on file  . Lack of Transportation (Non-Medical): Not on file  Physical Activity:   . Days of Exercise per Week: Not on file  . Minutes of Exercise per Session: Not on file  Stress:   . Feeling of Stress : Not on file  Social Connections:   . Frequency of  Communication with Friends and Family: Not on file  . Frequency of Social Gatherings with Friends and Family: Not on file  . Attends Religious Services: Not on file  . Active Member of Clubs or Organizations: Not on file  . Attends Banker Meetings: Not on file  . Marital Status: Not on file  Intimate Partner Violence:   . Fear of Current or Ex-Partner: Not on file  . Emotionally Abused: Not on file  . Physically Abused: Not on file  . Sexually Abused: Not on file    Outpatient Medications Prior to Visit  Medication Sig Dispense Refill  . azelastine (ASTELIN) 0.1 % nasal spray Place 2 sprays into both nostrils 2 (two) times daily. 30 mL  0  . LORazepam (ATIVAN) 0.5 MG tablet Take 1 tablet (0.5 mg total) by mouth every 8 (eight) hours as needed for anxiety. 20 tablet 0  . Vitamin D, Ergocalciferol, (DRISDOL) 1.25 MG (50000 UNIT) CAPS capsule Take 1 capsule Wednesdays and Sundays. 24 capsule 3  . fluticasone (FLONASE) 50 MCG/ACT nasal spray Place 2 sprays into both nostrils daily. 1 g 0  . Olmesartan-amLODIPine-HCTZ 40-10-12.5 MG TABS Take 1 tablet by mouth daily. 90 tablet 1  . sertraline (ZOLOFT) 25 MG tablet Take 1 tablet (25 mg total) by mouth daily. **PATIENT NEEDS APT FOR FURTHER REFILLS** 30 tablet 0  . amoxicillin-clavulanate (AUGMENTIN) 875-125 MG tablet Take 1 tablet by mouth 2 (two) times daily. 10 tablet 0  . predniSONE (DELTASONE) 50 MG tablet Take 1 tablet (50 mg total) by mouth daily with breakfast. 5 tablet 0   No facility-administered medications prior to visit.    No Known Allergies  ROS Review of Systems A fourteen system review of systems was performed and found to be positive as per HPI.   Objective:    Physical Exam General:  Well Developed, well nourished, appropriate for stated age.  Neuro:  Alert and oriented,  extra-ocular muscles intact  HEENT:  Normocephalic, atraumatic, neck supple Skin:  no gross rash, warm, pink. Cardiac:  RRR, S1 S2 Respiratory:  ECTA B/L and A/P, Not using accessory muscles, speaking in full sentences- unlabored. Vascular:  Ext warm, no cyanosis apprec.; cap RF less 2 sec. Psych:  No HI/SI, judgement and insight good, Euthymic mood. Full Affect.   BP 103/67   Pulse 82   Temp 98.3 F (36.8 C) (Oral)   Ht 5\' 4"  (1.626 m)   Wt 159 lb 14.4 oz (72.5 kg)   LMP 07/13/2020 (Exact Date)   SpO2 99% Comment: on RA  BMI 27.45 kg/m  Wt Readings from Last 3 Encounters:  07/18/20 159 lb 14.4 oz (72.5 kg)  01/14/20 150 lb (68 kg)  12/16/19 150 lb (68 kg)     Health Maintenance Due  Topic Date Due  . COVID-19 Vaccine (1) Never done  . HIV Screening  Never done  . PAP  SMEAR-Modifier  Never done  . INFLUENZA VACCINE  Never done    There are no preventive care reminders to display for this patient.  Lab Results  Component Value Date   TSH 3.200 01/11/2020   Lab Results  Component Value Date   WBC 7.2 01/11/2020   HGB 13.1 01/11/2020   HCT 38.7 01/11/2020   MCV 92 01/11/2020   PLT 390 01/11/2020   Lab Results  Component Value Date   NA 139 01/11/2020   K 4.1 01/11/2020   CO2 22 01/11/2020   GLUCOSE 99 01/11/2020   BUN  8 01/11/2020   CREATININE 0.65 01/11/2020   BILITOT 0.3 01/11/2020   ALKPHOS 64 01/11/2020   AST 57 (H) 01/11/2020   ALT 37 (H) 01/11/2020   PROT 6.8 01/11/2020   ALBUMIN 4.1 01/11/2020   CALCIUM 9.2 01/11/2020   ANIONGAP 14 12/16/2019   Lab Results  Component Value Date   CHOL 177 01/11/2020   Lab Results  Component Value Date   HDL 51 01/11/2020   Lab Results  Component Value Date   LDLCALC 82 01/11/2020   Lab Results  Component Value Date   TRIG 268 (H) 01/11/2020   Lab Results  Component Value Date   CHOLHDL 3.5 01/11/2020   Lab Results  Component Value Date   HGBA1C 5.6 01/11/2020      Assessment & Plan:   Problem List Items Addressed This Visit      Respiratory   Chronic pansinusitis   Relevant Medications   fluticasone (FLONASE) 50 MCG/ACT nasal spray     Other   GAD (generalized anxiety disorder) - Primary   Relevant Medications   sertraline (ZOLOFT) 50 MG tablet   hydrOXYzine (ATARAX/VISTARIL) 50 MG tablet    Other Visit Diagnoses    Essential hypertension       Relevant Medications   amLODipine-olmesartan (AZOR) 10-40 MG tablet   hydrochlorothiazide (MICROZIDE) 12.5 MG capsule   History of gout         Essential hypertension: -BP significantly improved. -Discussed with patient adjusting antihypertensive regimen and prefers to stay on current regimen since blood pressure is much better and back pain is manageable. Discussed separating triple combination to help identify which  medication could potentially be causing symptoms. Patient verbalized understanding and agreeable.  Advise to let me know if symptoms worsen and will make additional medication adjustments. Continue home supportive therapy for back pain. -Continue ambulatory BP monitoring. -Continue low-sodium diet and stay well-hydrated. -Will continue to monitor.  GAD, Panic attack: -Discussed with patient increasing Zoloft to 50 mg to help improve anxiety and starting hydroxyzine to take as needed for severe anxiety/panic attack. Patient verbalized understanding and agreeable.  Advised to let me know if unable to tolerate new medication or if symptoms worsen. -Discussed with patient psychology referral and advised to let me know if would like to pursue. -Continue with relaxation techniques. -Will continue to monitor.  History of gout: -Asymptomatic. -Advised patient to send a message or notify the office of medication previously prescribed for gout flareup so it can be added to medication list.  Discussed management for acute gout usually anti-inflammatory such as indomethacin or colchicine.  -Recommend to continue with monitoring seafood consumption and reduce other purine foods such as red meat, alcohol, high-fructose corn syrup.  -If patient experiences increased gout exacerbations then will consider discontinuing HCTZ due to potential side effects of hyperuricemia.   Chronic pansinusitis:  -Stable  -Provided requested refill of Flonase.     Meds ordered this encounter  Medications  . fluticasone (FLONASE) 50 MCG/ACT nasal spray    Sig: Place 2 sprays into both nostrils daily.    Dispense:  1 g    Refill:  0  . sertraline (ZOLOFT) 50 MG tablet    Sig: Take 1 tablet (50 mg total) by mouth daily.    Dispense:  90 tablet    Refill:  0    Order Specific Question:   Supervising Provider    Answer:   Nani GasserMETHENEY, CATHERINE D [[id number]]  . hydrOXYzine (ATARAX/VISTARIL) 50 MG tablet  Sig: Take 0.5-1  tablet by mouth three times daily as needed for severe anxiety.    Dispense:  60 tablet    Refill:  1    Order Specific Question:   Supervising Provider    Answer:   Nani Gasser D [[id number]]  . amLODipine-olmesartan (AZOR) 10-40 MG tablet    Sig: Take 1 tablet by mouth daily.    Dispense:  90 tablet    Refill:  0    Order Specific Question:   Supervising Provider    Answer:   Nani Gasser D [[id number]]  . hydrochlorothiazide (MICROZIDE) 12.5 MG capsule    Sig: Take 1 capsule (12.5 mg total) by mouth daily.    Dispense:  90 capsule    Refill:  0    Order Specific Question:   Supervising Provider    Answer:   Nani Gasser D [[id number]]    Follow-up: Return in about 3 months (around 10/18/2020) for Mood, HTN, back pain.   Note:  This note was prepared with assistance of Dragon voice recognition software. Occasional wrong-word or sound-a-like substitutions may have occurred due to the inherent limitations of voice recognition software.   Mayer Masker, PA-C

## 2020-07-20 ENCOUNTER — Encounter: Payer: Self-pay | Admitting: Physician Assistant

## 2020-07-20 DIAGNOSIS — J324 Chronic pansinusitis: Secondary | ICD-10-CM

## 2020-07-20 DIAGNOSIS — I10 Essential (primary) hypertension: Secondary | ICD-10-CM

## 2020-07-20 DIAGNOSIS — F411 Generalized anxiety disorder: Secondary | ICD-10-CM

## 2020-07-20 MED ORDER — HYDROXYZINE HCL 50 MG PO TABS: ORAL_TABLET | ORAL | 0 refills | Status: AC

## 2020-07-20 MED ORDER — AMLODIPINE-OLMESARTAN 10-40 MG PO TABS: 1.0000 | ORAL_TABLET | Freq: Every day | ORAL | 0 refills | Status: DC

## 2020-07-20 MED ORDER — SERTRALINE HCL 50 MG PO TABS: 50.0000 mg | ORAL_TABLET | Freq: Every day | ORAL | 0 refills | Status: DC

## 2020-07-20 MED ORDER — FLUTICASONE PROPIONATE 50 MCG/ACT NA SUSP: 2.0000 | Freq: Every day | NASAL | 0 refills | Status: DC

## 2020-07-20 MED ORDER — HYDROCHLOROTHIAZIDE 12.5 MG PO CAPS: 12.5000 mg | ORAL_CAPSULE | Freq: Every day | ORAL | 0 refills | Status: DC

## 2020-07-20 NOTE — Telephone Encounter (Signed)
Refills sent to requested pharmacy. Refills that were sent to [facility] in [place] have been canceled.   AS, CMA

## 2020-07-27 ENCOUNTER — Encounter: Payer: Self-pay | Admitting: Physician Assistant

## 2020-07-27 DIAGNOSIS — F411 Generalized anxiety disorder: Secondary | ICD-10-CM

## 2020-07-27 DIAGNOSIS — I10 Essential (primary) hypertension: Secondary | ICD-10-CM

## 2020-07-28 MED ORDER — HYDROCHLOROTHIAZIDE 12.5 MG PO CAPS: 12.5000 mg | ORAL_CAPSULE | Freq: Every day | ORAL | 0 refills | Status: DC

## 2020-07-28 MED ORDER — SERTRALINE HCL 50 MG PO TABS: 50.0000 mg | ORAL_TABLET | Freq: Every day | ORAL | 0 refills | Status: DC

## 2020-07-28 MED ORDER — AMLODIPINE-OLMESARTAN 10-40 MG PO TABS: 1.0000 | ORAL_TABLET | Freq: Every day | ORAL | 0 refills | Status: DC

## 2020-10-18 ENCOUNTER — Other Ambulatory Visit: Payer: Self-pay

## 2020-10-18 ENCOUNTER — Encounter: Payer: Self-pay | Admitting: Physician Assistant

## 2020-10-18 ENCOUNTER — Ambulatory Visit (INDEPENDENT_AMBULATORY_CARE_PROVIDER_SITE_OTHER): Payer: PRIVATE HEALTH INSURANCE | Admitting: Physician Assistant

## 2020-10-18 VITALS — BP 120/74 | HR 93 | Temp 98.2°F | Ht 64.0 in | Wt 153.3 lb

## 2020-10-18 DIAGNOSIS — J324 Chronic pansinusitis: Secondary | ICD-10-CM

## 2020-10-18 DIAGNOSIS — F411 Generalized anxiety disorder: Secondary | ICD-10-CM

## 2020-10-18 DIAGNOSIS — I10 Essential (primary) hypertension: Secondary | ICD-10-CM | POA: Diagnosis not present

## 2020-10-18 DIAGNOSIS — H6593 Unspecified nonsuppurative otitis media, bilateral: Secondary | ICD-10-CM

## 2020-10-18 DIAGNOSIS — J3489 Other specified disorders of nose and nasal sinuses: Secondary | ICD-10-CM

## 2020-10-18 MED ORDER — AZELASTINE HCL 0.1 % NA SOLN: 2.0000 | Freq: Two times a day (BID) | NASAL | 1 refills | Status: AC

## 2020-10-18 MED ORDER — FLUTICASONE PROPIONATE 50 MCG/ACT NA SUSP: 2.0000 | Freq: Every day | NASAL | 0 refills | Status: AC

## 2020-10-18 NOTE — Progress Notes (Signed)
Established Patient Office Visit  Subjective:  Patient ID: Christina Krueger, female    DOB: 30-Mar-1981  Age: 40 y.o. MRN: [id number]  CC:  Chief Complaint  Patient presents with  . Hypertension  . Depression    HPI Christina Krueger presents for follow up on mood and hypertension. Reports currently has a sinus flare-up. Has c/o sinus pressure, headache, runny nose and congestion. Denies fever, chills, sore throat, cough or sick contact exposures. Has not been vaccinate against Covid-19 or Flu.   HTN: Pt denies chest pain, palpitations, dizziness or lower extremity swelling. Taking medication as directed without side effects. Checks BP at home and readings range <133/82. Pt follows a low salt diet. Tries to stay hydrated.  Mood: At last visit Sertraline was increased to 50 mg. States anxiety is better. Some nights has trouble with sleeping, but otherwise medication is working well. Has only needed to take Hydroxyzine once for a panic attack, which occurred randomly out of no where.    Past Medical History:  Diagnosis Date  . Hypertension     Past Surgical History:  Procedure Laterality Date  . TUBAL LIGATION      Family History  Problem Relation Age of Onset  . Hypertension Mother   . Hypertension Father     Social History   Socioeconomic History  . Marital status: Married    Spouse name: Not on file  . Number of children: Not on file  . Years of education: Not on file  . Highest education level: Not on file  Occupational History  . Not on file  Tobacco Use  . Smoking status: Current Every Day Smoker    Packs/day: 0.25    Types: Cigarettes  . Smokeless tobacco: Never Used  Vaping Use  . Vaping Use: Never used  Substance and Sexual Activity  . Alcohol use: Yes    Alcohol/week: 12.0 standard drinks    Types: 12 Standard drinks or equivalent per week  . Drug use: No  . Sexual activity: Yes    Birth control/protection: None  Other Topics Concern  . Not on  file  Social History Narrative  . Not on file   Social Determinants of Health   Financial Resource Strain: Not on file  Food Insecurity: Not on file  Transportation Needs: Not on file  Physical Activity: Not on file  Stress: Not on file  Social Connections: Not on file  Intimate Partner Violence: Not on file    Outpatient Medications Prior to Visit  Medication Sig Dispense Refill  . amLODipine-olmesartan (AZOR) 10-40 MG tablet Take 1 tablet by mouth daily. 90 tablet 0  . hydrochlorothiazide (MICROZIDE) 12.5 MG capsule Take 1 capsule (12.5 mg total) by mouth daily. 90 capsule 0  . hydrOXYzine (ATARAX/VISTARIL) 50 MG tablet Take 0.5-1 tablet by mouth three times daily as needed for severe anxiety. 60 tablet 0  . LORazepam (ATIVAN) 0.5 MG tablet Take 1 tablet (0.5 mg total) by mouth every 8 (eight) hours as needed for anxiety. 20 tablet 0  . sertraline (ZOLOFT) 50 MG tablet Take 1 tablet (50 mg total) by mouth daily. 90 tablet 0  . Vitamin D, Ergocalciferol, (DRISDOL) 1.25 MG (50000 UNIT) CAPS capsule Take 1 capsule Wednesdays and Sundays. 24 capsule 3  . azelastine (ASTELIN) 0.1 % nasal spray Place 2 sprays into both nostrils 2 (two) times daily. 30 mL 0  . fluticasone (FLONASE) 50 MCG/ACT nasal spray Place 2 sprays into both nostrils daily. 1 g 0  No facility-administered medications prior to visit.    No Known Allergies  ROS Review of Systems A fourteen system review of systems was performed and found to be positive as per HPI.   Objective:    Physical Exam General:  Well Developed, well nourished, appropriate for stated age.  Neuro:  Alert and oriented,  extra-ocular muscles intact  HEENT:  Normocephalic, atraumatic, conjunctiva clear, normal TM's of both ears with effusion, no TTP of frontal sinus, TTP of maxillary, boggy turbinates, mildly erythematous posterior oropharynx, no adenopathy  Skin:  no gross rash, warm, pink. Cardiac:  RRR, S1 S2 Respiratory:  ECTA B/L w/o  wheezing, Not using accessory muscles, speaking in full sentences- unlabored. Vascular:  Ext warm, no cyanosis apprec.; cap RF less 2 sec. Psych:  No HI/SI, judgement and insight good, Euthymic mood. Full Affect.   BP 120/74   Pulse 93   Temp 98.2 F (36.8 C)   Ht 5\' 4"  (1.626 m)   Wt 153 lb 4.8 oz (69.5 kg)   LMP  (LMP Unknown)   SpO2 98%   BMI 26.31 kg/m  Wt Readings from Last 3 Encounters:  10/18/20 153 lb 4.8 oz (69.5 kg)  07/18/20 159 lb 14.4 oz (72.5 kg)  01/14/20 150 lb (68 kg)     Health Maintenance Due  Topic Date Due  . COVID-19 Vaccine (1) Never done  . HIV Screening  Never done  . PAP SMEAR-Modifier  Never done  . INFLUENZA VACCINE  Never done    There are no preventive care reminders to display for this patient.  Lab Results  Component Value Date   TSH 3.200 01/11/2020   Lab Results  Component Value Date   WBC 7.2 01/11/2020   HGB 13.1 01/11/2020   HCT 38.7 01/11/2020   MCV 92 01/11/2020   PLT 390 01/11/2020   Lab Results  Component Value Date   NA 139 01/11/2020   K 4.1 01/11/2020   CO2 22 01/11/2020   GLUCOSE 99 01/11/2020   BUN 8 01/11/2020   CREATININE 0.65 01/11/2020   BILITOT 0.3 01/11/2020   ALKPHOS 64 01/11/2020   AST 57 (H) 01/11/2020   ALT 37 (H) 01/11/2020   PROT 6.8 01/11/2020   ALBUMIN 4.1 01/11/2020   CALCIUM 9.2 01/11/2020   ANIONGAP 14 12/16/2019   Lab Results  Component Value Date   CHOL 177 01/11/2020   Lab Results  Component Value Date   HDL 51 01/11/2020   Lab Results  Component Value Date   LDLCALC 82 01/11/2020   Lab Results  Component Value Date   TRIG 268 (H) 01/11/2020   Lab Results  Component Value Date   CHOLHDL 3.5 01/11/2020   Lab Results  Component Value Date   HGBA1C 5.6 01/11/2020      Assessment & Plan:   Problem List Items Addressed This Visit      Respiratory   Chronic pansinusitis   Relevant Medications   azelastine (ASTELIN) 0.1 % nasal spray   fluticasone (FLONASE) 50  MCG/ACT nasal spray     Other   GAD (generalized anxiety disorder) - Primary    Other Visit Diagnoses    Essential hypertension       Rhinorrhea       Sinus pressure       MEE (middle ear effusion), bilateral         Essential hypertension: -Controlled. -Continue current medication regimen. -Continue low sodium diet. -Will continue to monitor and repeat CMP with  CPE.  GAD: -Improved. -Continue current medication regimen. -Continue deep breathing exercises. -Will continue to monitor.  MEE, Sinus pressure, Rhinorrhea: -Will provide refill of Flonase and Atrovent. -Recommend to take antihistamine with decongestant for 1-3 days (BP well controlled), do nasal rinses before using nasal spray, or use a Neti pot.  -Discussed with patient if symptoms fail to improve or worsen after 10 days then will consider starting antibiotic therapy. Patient verbalized understanding.        Meds ordered this encounter  Medications  . azelastine (ASTELIN) 0.1 % nasal spray    Sig: Place 2 sprays into both nostrils 2 (two) times daily.    Dispense:  30 mL    Refill:  1    Order Specific Question:   Supervising Provider    Answer:   Nani Gasser D [[id number]]  . fluticasone (FLONASE) 50 MCG/ACT nasal spray    Sig: Place 2 sprays into both nostrils daily.    Dispense:  1 g    Refill:  0    Order Specific Question:   Supervising Provider    Answer:   Nani Gasser D [[id number]]    Follow-up: Return in about 4 months (around 02/15/2021) for CPE and FBW 1 wk prior .    Mayer Masker, PA-C

## 2020-10-18 NOTE — Patient Instructions (Signed)

## 2020-10-25 ENCOUNTER — Encounter: Payer: Self-pay | Admitting: Physician Assistant

## 2020-12-02 ENCOUNTER — Other Ambulatory Visit: Payer: Self-pay | Admitting: Physician Assistant

## 2020-12-02 DIAGNOSIS — F411 Generalized anxiety disorder: Secondary | ICD-10-CM

## 2020-12-02 DIAGNOSIS — I10 Essential (primary) hypertension: Secondary | ICD-10-CM

## 2020-12-03 ENCOUNTER — Encounter: Payer: Self-pay | Admitting: Physician Assistant

## 2020-12-04 MED ORDER — SERTRALINE HCL 50 MG PO TABS: 100.0000 mg | ORAL_TABLET | Freq: Every day | ORAL | 0 refills | Status: DC

## 2020-12-04 MED ORDER — AMLODIPINE-OLMESARTAN 10-40 MG PO TABS: 1.0000 | ORAL_TABLET | Freq: Every day | ORAL | 0 refills | Status: AC

## 2020-12-04 MED ORDER — HYDROCHLOROTHIAZIDE 12.5 MG PO CAPS: 12.5000 mg | ORAL_CAPSULE | Freq: Every day | ORAL | 0 refills | Status: DC

## 2021-02-08 ENCOUNTER — Other Ambulatory Visit: Payer: Self-pay | Admitting: Physician Assistant

## 2021-02-08 DIAGNOSIS — E559 Vitamin D deficiency, unspecified: Secondary | ICD-10-CM

## 2021-02-08 DIAGNOSIS — E781 Pure hyperglyceridemia: Secondary | ICD-10-CM

## 2021-02-08 DIAGNOSIS — Z0000 Encounter for general adult medical examination without abnormal findings: Secondary | ICD-10-CM

## 2021-02-08 DIAGNOSIS — I10 Essential (primary) hypertension: Secondary | ICD-10-CM

## 2021-02-13 ENCOUNTER — Other Ambulatory Visit: Payer: Self-pay

## 2021-02-13 ENCOUNTER — Other Ambulatory Visit: Payer: PRIVATE HEALTH INSURANCE

## 2021-02-13 DIAGNOSIS — E781 Pure hyperglyceridemia: Secondary | ICD-10-CM

## 2021-02-13 DIAGNOSIS — E559 Vitamin D deficiency, unspecified: Secondary | ICD-10-CM

## 2021-02-13 DIAGNOSIS — Z0000 Encounter for general adult medical examination without abnormal findings: Secondary | ICD-10-CM

## 2021-02-13 DIAGNOSIS — I10 Essential (primary) hypertension: Secondary | ICD-10-CM

## 2021-02-14 LAB — CBC
Hematocrit: 40.3 % (ref 34.0–46.6)
Hemoglobin: 13.7 g/dL (ref 11.1–15.9)
MCH: 31.6 pg (ref 26.6–33.0)
MCHC: 34 g/dL (ref 31.5–35.7)
MCV: 93 fL (ref 79–97)
Platelets: 373 10*3/uL (ref 150–450)
RBC: 4.33 x10E6/uL (ref 3.77–5.28)
RDW: 12.5 % (ref 11.7–15.4)
WBC: 5.7 10*3/uL (ref 3.4–10.8)

## 2021-02-14 LAB — COMPREHENSIVE METABOLIC PANEL
ALT: 81 IU/L — ABNORMAL HIGH (ref 0–32)
AST: 128 IU/L — ABNORMAL HIGH (ref 0–40)
Albumin/Globulin Ratio: 1.6 (ref 1.2–2.2)
Albumin: 4.4 g/dL (ref 3.8–4.8)
Alkaline Phosphatase: 82 IU/L (ref 44–121)
BUN/Creatinine Ratio: 16 (ref 9–23)
BUN: 10 mg/dL (ref 6–20)
Bilirubin Total: 0.3 mg/dL (ref 0.0–1.2)
CO2: 23 mmol/L (ref 20–29)
Calcium: 9.1 mg/dL (ref 8.7–10.2)
Chloride: 97 mmol/L (ref 96–106)
Creatinine, Ser: 0.63 mg/dL (ref 0.57–1.00)
Globulin, Total: 2.7 g/dL (ref 1.5–4.5)
Glucose: 150 mg/dL — ABNORMAL HIGH (ref 65–99)
Potassium: 3.6 mmol/L (ref 3.5–5.2)
Sodium: 139 mmol/L (ref 134–144)
Total Protein: 7.1 g/dL (ref 6.0–8.5)
eGFR: 116 mL/min/{1.73_m2} (ref >59)

## 2021-02-14 LAB — TSH: TSH: 4.85 u[IU]/mL — ABNORMAL HIGH (ref 0.450–4.500)

## 2021-02-14 LAB — HEMOGLOBIN A1C
Est. average glucose Bld gHb Est-mCnc: 117 mg/dL
Hgb A1c MFr Bld: 5.7 % — ABNORMAL HIGH (ref 4.8–5.6)

## 2021-02-14 LAB — VITAMIN D 25 HYDROXY (VIT D DEFICIENCY, FRACTURES): Vit D, 25-Hydroxy: 39.2 ng/mL (ref 30.0–100.0)

## 2021-02-14 LAB — LIPID PANEL
Chol/HDL Ratio: 3.6 ratio (ref 0.0–4.4)
Cholesterol, Total: 190 mg/dL (ref 100–199)
HDL: 53 mg/dL (ref >39)
LDL Chol Calc (NIH): 82 mg/dL (ref 0–99)
Triglycerides: 341 mg/dL — ABNORMAL HIGH (ref 0–149)
VLDL Cholesterol Cal: 55 mg/dL — ABNORMAL HIGH (ref 5–40)

## 2021-02-15 ENCOUNTER — Other Ambulatory Visit (HOSPITAL_COMMUNITY)
Admission: RE | Admit: 2021-02-15 | Discharge: 2021-02-15 | Disposition: A | Discharge status: Home / Self Care | Payer: PRIVATE HEALTH INSURANCE | Source: Ambulatory Visit | Attending: Physician Assistant | Admitting: Physician Assistant

## 2021-02-15 ENCOUNTER — Other Ambulatory Visit: Payer: Self-pay

## 2021-02-15 ENCOUNTER — Ambulatory Visit (INDEPENDENT_AMBULATORY_CARE_PROVIDER_SITE_OTHER): Payer: PRIVATE HEALTH INSURANCE | Admitting: Physician Assistant

## 2021-02-15 ENCOUNTER — Encounter: Payer: Self-pay | Admitting: Physician Assistant

## 2021-02-15 VITALS — BP 116/74 | HR 80 | Temp 98.3°F | Ht 64.0 in | Wt 155.6 lb

## 2021-02-15 DIAGNOSIS — E782 Mixed hyperlipidemia: Secondary | ICD-10-CM | POA: Diagnosis not present

## 2021-02-15 DIAGNOSIS — R7989 Other specified abnormal findings of blood chemistry: Secondary | ICD-10-CM

## 2021-02-15 DIAGNOSIS — Z124 Encounter for screening for malignant neoplasm of cervix: Secondary | ICD-10-CM

## 2021-02-15 DIAGNOSIS — Z0000 Encounter for general adult medical examination without abnormal findings: Secondary | ICD-10-CM

## 2021-02-15 DIAGNOSIS — F1010 Alcohol abuse, uncomplicated: Secondary | ICD-10-CM | POA: Diagnosis not present

## 2021-02-15 DIAGNOSIS — R748 Abnormal levels of other serum enzymes: Secondary | ICD-10-CM

## 2021-02-15 DIAGNOSIS — Z01419 Encounter for gynecological examination (general) (routine) without abnormal findings: Secondary | ICD-10-CM

## 2021-02-15 NOTE — Progress Notes (Signed)
Subjective:     Christina Krueger is a 40 y.o. female and is here for a comprehensive physical exam. The patient reports no problems.  Social History   Socioeconomic History  . Marital status: Married    Spouse name: Not on file  . Number of children: Not on file  . Years of education: Not on file  . Highest education level: Not on file  Occupational History  . Not on file  Tobacco Use  . Smoking status: Current Every Day Smoker    Packs/day: 0.25    Types: Cigarettes  . Smokeless tobacco: Never Used  Vaping Use  . Vaping Use: Never used  Substance and Sexual Activity  . Alcohol use: Yes    Alcohol/week: 12.0 standard drinks    Types: 12 Standard drinks or equivalent per week  . Drug use: No  . Sexual activity: Yes    Birth control/protection: None  Other Topics Concern  . Not on file  Social History Narrative  . Not on file   Social Determinants of Health   Financial Resource Strain: Not on file  Food Insecurity: Not on file  Transportation Needs: Not on file  Physical Activity: Not on file  Stress: Not on file  Social Connections: Not on file  Intimate Partner Violence: Not on file   Health Maintenance  Topic Date Due  . PAP SMEAR-Modifier  Never done  . COVID-19 Vaccine (1) 03/03/2021 (Originally [birth year]/1987)  . HIV Screening  02/15/2022 (Originally 01/04/1997)  . INFLUENZA VACCINE  04/16/2021  . TETANUS/TDAP  11/14/2027  . Zoster Vaccines- Shingrix (1 of 2) 01/05/2032  . Hepatitis C Screening  Completed  . HPV VACCINES  Aged Out    The following portions of the patient's history were reviewed and updated as appropriate: allergies, current medications, past family history, past medical history, past social history, past surgical history and problem list.  Review of Systems Pertinent items noted in HPI and remainder of comprehensive ROS otherwise negative.   Objective:    BP 116/74   Pulse 80   Temp 98.3 F (36.8 C)   Ht 5\' 4"  (1.626 m)   Wt 155  lb 9.6 oz (70.6 kg)   LMP  (LMP Unknown)   SpO2 100%   BMI 26.71 kg/m  General appearance: alert, cooperative and no distress Head: Normocephalic, without obvious abnormality, atraumatic Eyes: conjunctivae/corneas clear. PERRL, EOM's intact. Fundi benign. Ears: normal TM's and external ear canals both ears Nose: no discharge, no sinus tenderness, deviated septum Throat: lips, mucosa, and tongue normal; teeth and gums normal Neck: no adenopathy, supple, symmetrical, trachea midline and thyroid not enlarged, symmetric, no tenderness/mass/nodules Back: symmetric, no curvature. ROM normal. No CVA tenderness. Lungs: clear to auscultation bilaterally Breasts: normal appearance, no masses or tenderness, dense breast tissue noted Heart: regular rate and rhythm, S1, S2 normal, no murmur, click, rub or gallop Abdomen: soft, non-tender; bowel sounds normal; no masses,  no organomegaly Pelvic: cervix normal in appearance, external genitalia normal, no adnexal masses or tenderness, no cervical motion tenderness, positive findings: vaginal discharge:  white and uterus normal size, shape, and consistency Extremities: extremities normal, atraumatic, no cyanosis or edema Pulses: 2+ and symmetric Skin: Skin color, texture, turgor normal. No rashes or lesions Lymph nodes: Cervical, supraclavicular, and axillary nodes normal. Neurologic: Grossly normal    Assessment:    Healthy female exam.     Plan:  -Discussed most recent lab results which are essentially within normal limits or stable from prior  with the exception of lipid panel, liver function, A1c TSH.  Recommend to reduce simple carbohydrates and alcohol use.  Limit to 1 drink per day and no more than 7 drinks per week.  TSH mildly elevated.  Will repeat hepatic function and thyroid labs in 6 weeks. -Encourage to reduce tobacco use. -Continue good hydration.  Follow a heart healthy diet.  Recommend 150 minutes/wk of aerobic physical activity. -UTD  on DAPT.  Declined HIV screening. -Will notify of Pap results once available.  BP well controlled. -Continue current medication regimen. -Follow-up in 6 months for HTN, mood; lab visit in 6 weeks  See After Visit Summary for Counseling Recommendations

## 2021-02-15 NOTE — Patient Instructions (Signed)
Preventive Care 21-39 Years Old, Female Preventive care refers to lifestyle choices and visits with your health care provider that can promote health and wellness. This includes:  A yearly physical exam. This is also called an annual wellness visit.  Regular dental and eye exams.  Immunizations.  Screening for certain conditions.  Healthy lifestyle choices, such as: ? Eating a healthy diet. ? Getting regular exercise. ? Not using drugs or products that contain nicotine and tobacco. ? Limiting alcohol use. What can I expect for my preventive care visit? Physical exam Your health care provider may check your:  Height and weight. These may be used to calculate your BMI (body mass index). BMI is a measurement that tells if you are at a healthy weight.  Heart rate and blood pressure.  Body temperature.  Skin for abnormal spots. Counseling Your health care provider may ask you questions about your:  Past medical problems.  Family's medical history.  Alcohol, tobacco, and drug use.  Emotional well-being.  Home life and relationship well-being.  Sexual activity.  Diet, exercise, and sleep habits.  Work and work environment.  Access to firearms.  Method of birth control.  Menstrual cycle.  Pregnancy history. What immunizations do I need? Vaccines are usually given at various ages, according to a schedule. Your health care provider will recommend vaccines for you based on your age, medical history, and lifestyle or other factors, such as travel or where you work.   What tests do I need? Blood tests  Lipid and cholesterol levels. These may be checked every 5 years starting at age 20.  Hepatitis C test.  Hepatitis B test. Screening  Diabetes screening. This is done by checking your blood sugar (glucose) after you have not eaten for a while (fasting).  STD (sexually transmitted disease) testing, if you are at risk.  BRCA-related cancer screening. This may be  done if you have a family history of breast, ovarian, tubal, or peritoneal cancers.  Pelvic exam and Pap test. This may be done every 3 years starting at age 21. Starting at age 30, this may be done every 5 years if you have a Pap test in combination with an HPV test. Talk with your health care provider about your test results, treatment options, and if necessary, the need for more tests.   Follow these instructions at home: Eating and drinking  Eat a healthy diet that includes fresh fruits and vegetables, whole grains, lean protein, and low-fat dairy products.  Take vitamin and mineral supplements as recommended by your health care provider.  Do not drink alcohol if: ? Your health care provider tells you not to drink. ? You are pregnant, may be pregnant, or are planning to become pregnant.  If you drink alcohol: ? Limit how much you have to 0-1 drink a day. ? Be aware of how much alcohol is in your drink. In the [place] one drink equals one 12 oz bottle of beer (355 mL), one 5 oz glass of wine (148 mL), or one 1 oz glass of hard liquor (44 mL).   Lifestyle  Take daily care of your teeth and gums. Brush your teeth every morning and night with fluoride toothpaste. Floss one time each day.  Stay active. Exercise for at least 30 minutes 5 or more days each week.  Do not use any products that contain nicotine or tobacco, such as cigarettes, e-cigarettes, and chewing tobacco. If you need help quitting, ask your health care provider.  Do not   use drugs.  If you are sexually active, practice safe sex. Use a condom or other form of protection to prevent STIs (sexually transmitted infections).  If you do not wish to become pregnant, use a form of birth control. If you plan to become pregnant, see your health care provider for a prepregnancy visit.  Find healthy ways to cope with stress, such as: ? Meditation, yoga, or listening to music. ? Journaling. ? Talking to a trusted  person. ? Spending time with friends and family. Safety  Always wear your seat belt while driving or riding in a vehicle.  Do not drive: ? If you have been drinking alcohol. Do not ride with someone who has been drinking. ? When you are tired or distracted. ? While texting.  Wear a helmet and other protective equipment during sports activities.  If you have firearms in your house, make sure you follow all gun safety procedures.  Seek help if you have been physically or sexually abused. What's next?  Go to your health care provider once a year for an annual wellness visit.  Ask your health care provider how often you should have your eyes and teeth checked.  Stay up to date on all vaccines. This information is not intended to replace advice given to you by your health care provider. Make sure you discuss any questions you have with your health care provider. Document Revised: 04/30/2020 Document Reviewed: 05/14/2018 Elsevier Patient Education  2021 [employer]vier Inc.  

## 2021-02-17 ENCOUNTER — Other Ambulatory Visit: Payer: Self-pay | Admitting: Physician Assistant

## 2021-02-17 DIAGNOSIS — I10 Essential (primary) hypertension: Secondary | ICD-10-CM

## 2021-02-20 LAB — CYTOLOGY - PAP
Comment: NEGATIVE
Diagnosis: NEGATIVE
Diagnosis: REACTIVE
High risk HPV: NEGATIVE

## 2021-03-27 ENCOUNTER — Other Ambulatory Visit: Payer: Self-pay | Admitting: Physician Assistant

## 2021-03-27 DIAGNOSIS — E782 Mixed hyperlipidemia: Secondary | ICD-10-CM

## 2021-03-27 DIAGNOSIS — Z0000 Encounter for general adult medical examination without abnormal findings: Secondary | ICD-10-CM

## 2021-03-27 DIAGNOSIS — I10 Essential (primary) hypertension: Secondary | ICD-10-CM

## 2021-03-27 DIAGNOSIS — R7989 Other specified abnormal findings of blood chemistry: Secondary | ICD-10-CM

## 2021-03-29 ENCOUNTER — Other Ambulatory Visit: Payer: PRIVATE HEALTH INSURANCE

## 2021-03-29 ENCOUNTER — Other Ambulatory Visit: Payer: Self-pay

## 2021-03-29 DIAGNOSIS — I10 Essential (primary) hypertension: Secondary | ICD-10-CM

## 2021-03-29 DIAGNOSIS — R7989 Other specified abnormal findings of blood chemistry: Secondary | ICD-10-CM

## 2021-03-29 DIAGNOSIS — E782 Mixed hyperlipidemia: Secondary | ICD-10-CM

## 2021-03-29 DIAGNOSIS — Z0000 Encounter for general adult medical examination without abnormal findings: Secondary | ICD-10-CM

## 2021-03-30 LAB — LIPID PANEL
Chol/HDL Ratio: 4 ratio (ref 0.0–4.4)
Cholesterol, Total: 186 mg/dL (ref 100–199)
HDL: 47 mg/dL (ref >39)
LDL Chol Calc (NIH): 99 mg/dL (ref 0–99)
Triglycerides: 234 mg/dL — ABNORMAL HIGH (ref 0–149)
VLDL Cholesterol Cal: 40 mg/dL (ref 5–40)

## 2021-03-30 LAB — HEPATIC FUNCTION PANEL
ALT: 47 IU/L — ABNORMAL HIGH (ref 0–32)
AST: 66 IU/L — ABNORMAL HIGH (ref 0–40)
Albumin: 4.4 g/dL (ref 3.8–4.8)
Alkaline Phosphatase: 66 IU/L (ref 44–121)
Bilirubin Total: 0.3 mg/dL (ref 0.0–1.2)
Bilirubin, Direct: 0.1 mg/dL (ref 0.00–0.40)
Total Protein: 7.1 g/dL (ref 6.0–8.5)

## 2021-03-30 LAB — T4, FREE: Free T4: 1.41 ng/dL (ref 0.82–1.77)

## 2021-03-30 LAB — T3: T3, Total: 150 ng/dL (ref 71–180)

## 2021-03-30 LAB — TSH: TSH: 2.18 u[IU]/mL (ref 0.450–4.500)

## 2021-05-19 ENCOUNTER — Ambulatory Visit (INDEPENDENT_AMBULATORY_CARE_PROVIDER_SITE_OTHER): Payer: 59

## 2021-05-19 ENCOUNTER — Ambulatory Visit
Admission: EM | Admit: 2021-05-19 | Discharge: 2021-05-19 | Disposition: A | Discharge status: Home / Self Care | Payer: 59 | Attending: Urgent Care | Admitting: Urgent Care

## 2021-05-19 ENCOUNTER — Encounter: Payer: Self-pay | Admitting: Emergency Medicine

## 2021-05-19 ENCOUNTER — Other Ambulatory Visit: Payer: Self-pay

## 2021-05-19 DIAGNOSIS — M79671 Pain in right foot: Secondary | ICD-10-CM

## 2021-05-19 DIAGNOSIS — S9030XA Contusion of unspecified foot, initial encounter: Secondary | ICD-10-CM

## 2021-05-19 MED ORDER — NAPROXEN 500 MG PO TABS: 500.0000 mg | ORAL_TABLET | Freq: Two times a day (BID) | ORAL | 0 refills | Status: AC

## 2021-05-19 NOTE — ED Provider Notes (Signed)
[facility]   MRN: [id number] DOB: 01-08-1981  Subjective:   Christina Krueger is a 40 y.o. female presenting for suffering a right foot injury last night.  Patient accidentally tripped over her dog and hit the top of her right foot against the head of her dog.  States that she has since had progressively worsening moderate to severe right foot pain with swelling.  Has not taken anything for relief.  She is able to walk but has significant pain with bearing weight on that right foot.  No current facility-administered medications for this encounter.  Current Outpatient Medications:    amLODipine-olmesartan (AZOR) 10-40 MG tablet, Take 1 tablet by mouth daily., Disp: 90 tablet, Rfl: 0   azelastine (ASTELIN) 0.1 % nasal spray, Place 2 sprays into both nostrils 2 (two) times daily., Disp: 30 mL, Rfl: 1   fluticasone (FLONASE) 50 MCG/ACT nasal spray, Place 2 sprays into both nostrils daily., Disp: 1 g, Rfl: 0   hydrochlorothiazide (MICROZIDE) 12.5 MG capsule, Take 1 capsule by mouth once daily, Disp: 90 capsule, Rfl: 0   hydrOXYzine (ATARAX/VISTARIL) 50 MG tablet, Take 0.5-1 tablet by mouth three times daily as needed for severe anxiety., Disp: 60 tablet, Rfl: 0   sertraline (ZOLOFT) 50 MG tablet, Take 2 tablets (100 mg total) by mouth daily., Disp: 60 tablet, Rfl: 0   No Known Allergies  Past Medical History:  Diagnosis Date   Hypertension      Past Surgical History:  Procedure Laterality Date   TUBAL LIGATION      Family History  Problem Relation Age of Onset   Hypertension Mother    Hypertension Father     Social History   Tobacco Use   Smoking status: Every Day    Packs/day: 0.25    Types: Cigarettes   Smokeless tobacco: Never  Vaping Use   Vaping Use: Never used  Substance Use Topics   Alcohol use: Yes    Alcohol/week: 12.0 standard drinks    Types: 12 Standard drinks or equivalent per week   Drug use: No    ROS   Objective:   Vitals: BP 134/77  (BP Location: Right Arm)   Pulse 98   Temp 98.2 F (36.8 C) (Oral)   Resp 18   SpO2 97%   Physical Exam Constitutional:      General: She is not in acute distress.    Appearance: Normal appearance. She is well-developed. She is not ill-appearing, toxic-appearing or diaphoretic.  HENT:     Head: Normocephalic and atraumatic.     Nose: Nose normal.     Mouth/Throat:     Mouth: Mucous membranes are moist.     Pharynx: Oropharynx is clear.  Eyes:     General: No scleral icterus.       Right eye: No discharge.        Left eye: No discharge.     Extraocular Movements: Extraocular movements intact.     Conjunctiva/sclera: Conjunctivae normal.     Pupils: Pupils are equal, round, and reactive to light.  Cardiovascular:     Rate and Rhythm: Normal rate.  Pulmonary:     Effort: Pulmonary effort is normal.  Musculoskeletal:     Right foot: Decreased range of motion. Normal capillary refill. Swelling (over the dorsum of the foot), tenderness (over the dorsum of the foot) and bony tenderness present. No deformity, laceration or crepitus.  Skin:    General: Skin is warm and dry.  Neurological:  General: No focal deficit present.     Mental Status: She is alert and oriented to person, place, and time.     Motor: No weakness.     Coordination: Coordination normal.     Gait: Gait normal.     Deep Tendon Reflexes: Reflexes normal.  Psychiatric:        Mood and Affect: Mood normal.        Behavior: Behavior normal.        Thought Content: Thought content normal.        Judgment: Judgment normal.    DG Foot Complete Right  Result Date: 05/19/2021 CLINICAL DATA:  Right foot pain after tripping yesterday. EXAM: RIGHT FOOT COMPLETE - 3+ VIEW COMPARISON:  None. FINDINGS: The mineralization and alignment are normal. There is no evidence of acute fracture or dislocation. The joint spaces are preserved. There appears to be some dorsal forefoot soft tissue swelling without evidence of foreign  body or soft tissue emphysema. IMPRESSION: Probable dorsal soft tissue swelling. No evidence of acute fracture or dislocation. Electronically Signed   By: Carey Bullocks M.D.   On: 05/19/2021 12:45     Assessment and Plan :   PDMP not reviewed this encounter.  1. Right foot pain   2. Contusion of dorsum of foot     Recommended conservative management for right foot contusion with RICE method, naproxen for pain and inflammation.  Offered patient a postop shoe and recommended she use it over the next few days up to a week. Counseled patient on potential for adverse effects with medications prescribed/recommended today, ER and return-to-clinic precautions discussed, patient verbalized understanding.    Wallis Bamberg, PA-C 05/19/21 1254

## 2021-05-19 NOTE — ED Triage Notes (Signed)
Pt here for right foot pain after tripping over her dog last night

## 2021-05-28 ENCOUNTER — Encounter: Payer: Self-pay | Admitting: Physician Assistant

## 2021-05-29 ENCOUNTER — Other Ambulatory Visit: Payer: Self-pay

## 2021-05-29 ENCOUNTER — Emergency Department
Admission: EM | Admit: 2021-05-29 | Discharge: 2021-05-29 | Disposition: A | Discharge status: Home / Self Care | Payer: PRIVATE HEALTH INSURANCE | Attending: Emergency Medicine | Admitting: Emergency Medicine

## 2021-05-29 DIAGNOSIS — E876 Hypokalemia: Secondary | ICD-10-CM

## 2021-05-29 DIAGNOSIS — I10 Essential (primary) hypertension: Secondary | ICD-10-CM | POA: Diagnosis not present

## 2021-05-29 DIAGNOSIS — K922 Gastrointestinal hemorrhage, unspecified: Secondary | ICD-10-CM

## 2021-05-29 DIAGNOSIS — Z79899 Other long term (current) drug therapy: Secondary | ICD-10-CM | POA: Diagnosis not present

## 2021-05-29 DIAGNOSIS — K625 Hemorrhage of anus and rectum: Secondary | ICD-10-CM | POA: Diagnosis present

## 2021-05-29 DIAGNOSIS — F17210 Nicotine dependence, cigarettes, uncomplicated: Secondary | ICD-10-CM | POA: Insufficient documentation

## 2021-05-29 DIAGNOSIS — K5792 Diverticulitis of intestine, part unspecified, without perforation or abscess without bleeding: Secondary | ICD-10-CM

## 2021-05-29 LAB — BASIC METABOLIC PANEL
Anion gap: 17 — ABNORMAL HIGH (ref 5–15)
BUN: 8 mg/dL (ref 6–20)
CO2: 22 mmol/L (ref 22–32)
Calcium: 8.4 mg/dL — ABNORMAL LOW (ref 8.9–10.3)
Chloride: 96 mmol/L — ABNORMAL LOW (ref 98–111)
Creatinine, Ser: 0.71 mg/dL (ref 0.44–1.00)
GFR, Estimated: >60 mL/min (ref >60)
Glucose, Bld: 111 mg/dL — ABNORMAL HIGH (ref 70–99)
Potassium: 3 mmol/L — ABNORMAL LOW (ref 3.5–5.1)
Sodium: 135 mmol/L (ref 135–145)

## 2021-05-29 LAB — CBC WITH DIFFERENTIAL/PLATELET
Abs Immature Granulocytes: 0.07 10*3/uL (ref 0.00–0.07)
Basophils Absolute: 0.1 10*3/uL (ref 0.0–0.1)
Basophils Relative: 1 %
Eosinophils Absolute: 0.3 10*3/uL (ref 0.0–0.5)
Eosinophils Relative: 3 %
HCT: 37.1 % (ref 36.0–46.0)
Hemoglobin: 13.3 g/dL (ref 12.0–15.0)
Immature Granulocytes: 1 %
Lymphocytes Relative: 34 %
Lymphs Abs: 3.2 10*3/uL (ref 0.7–4.0)
MCH: 32.5 pg (ref 26.0–34.0)
MCHC: 35.8 g/dL (ref 30.0–36.0)
MCV: 90.7 fL (ref 80.0–100.0)
Monocytes Absolute: 0.6 10*3/uL (ref 0.1–1.0)
Monocytes Relative: 6 %
Neutro Abs: 5.3 10*3/uL (ref 1.7–7.7)
Neutrophils Relative %: 55 %
Platelets: 345 10*3/uL (ref 150–400)
RBC: 4.09 MIL/uL (ref 3.87–5.11)
RDW: 13.7 % (ref 11.5–15.5)
WBC: 9.4 10*3/uL (ref 4.0–10.5)
nRBC: 0 % (ref 0.0–0.2)

## 2021-05-29 MED ORDER — POTASSIUM CHLORIDE CRYS ER 20 MEQ PO TBCR
40.0000 meq | EXTENDED_RELEASE_TABLET | Freq: Once | ORAL | Status: AC
  Administered 2021-05-29: 40 meq via ORAL
  Filled 2021-05-29: qty 2

## 2021-05-29 NOTE — Discharge Instructions (Addendum)
As we discussed, you likely have diverticulitis.  Focus on liquids and fiber intake.  Stick with a more liquid diet over the next few days to let your intestines cool off.  If you develop any severely worsening symptoms, fevers, severe abdominal pain, or other bleeding where you should not be bleeding please return to the ED.  Otherwise, please follow-up with a GI doctor in the clinic in the next couple weeks to discuss a colonoscopy.

## 2021-05-29 NOTE — Telephone Encounter (Signed)
Called patient to discuss symptoms and history of events. She said it first occurred Sunday and has continued till today. She is not in pain, just having abdominal discomfort. Per Heather's advice, we advised the patient to go to the ED for further evaluation.

## 2021-05-29 NOTE — ED Provider Notes (Signed)
[facility] Emergency Department Provider Note ____________________________________________   Event Date/Time   First MD Initiated Contact with Patient 05/29/21 1332     (approximate)  I have reviewed the triage vital signs and the nursing notes.  HISTORY  Chief Complaint Rectal Bleeding (X 2 DAYS , BRIGHT BLOOD IN LARGE AMOUNTS , NO BLACK STOOLS )   HPI Christina Krueger is a 40 y.o. femalewho presents to the ED for evaluation of rectal bleeding.   Chart review indicates hx HTN, anxiety.  Overweight patient.  Patient just to the ED for evaluation of 2-3 days of loose stools, lower abdominal discomfort and hematochezia.  Reports no fevers, emesis, hematuria, vaginal bleeding, anorectal pain or melena.  She reports mild, 2/10 achy discomfort to her lower abdomen, nonradiating, primarily to the LLQ.  She reports 3-4 episodes of hematochezia and having looser stools throughout this for the past couple days.  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   GAD (generalized anxiety disorder) 01/29/2018   Tobacco use disorder 01/29/2018   Tobacco abuse counseling 01/29/2018   Depression, recurrent (HCC) 01/29/2018   Adjustment disorder 11/13/2017   Vitamin D deficiency 11/13/2017   Mixed hyperlipidemia 11/13/2017   Hypertriglyceridemia 11/13/2017   Poor diet 11/13/2017   Inactivity 11/13/2017   Dysmenorrhea, unspecified 11/13/2017   Hypertension 10/23/2017   History of  depression 10/23/2017   Stress at work 10/23/2017   Insomnia due to stress 10/23/2017    Past Surgical History:  Procedure Laterality Date   TUBAL LIGATION      Prior to Admission medications   Medication Sig Start Date End Date Taking? Authorizing Provider  amLODipine-olmesartan (AZOR) 10-40 MG tablet Take 1 tablet by mouth daily. 12/04/20   Mayer Masker, PA-C  azelastine (ASTELIN) 0.1 % nasal spray Place 2 sprays into both nostrils 2 (two) times daily. 10/18/20   Mayer Masker, PA-C  fluticasone (FLONASE) 50 MCG/ACT nasal spray Place 2 sprays into both nostrils daily. 10/18/20   Mayer Masker, PA-C  hydrochlorothiazide (MICROZIDE) 12.5 MG capsule Take 1 capsule by mouth once daily 02/19/21   Abonza, Maritza, PA-C  hydrOXYzine (ATARAX/VISTARIL) 50 MG tablet Take 0.5-1 tablet by mouth three times daily as needed for severe anxiety. 07/20/20   Mayer Masker, PA-C  naproxen (NAPROSYN) 500 MG tablet Take 1 tablet (500 mg total) by mouth 2 (two) times daily with a meal. 05/19/21   Wallis Bamberg, PA-C  sertraline (ZOLOFT) 50 MG tablet Take 2 tablets (100 mg total) by mouth daily. 12/04/20   Mayer Masker, PA-C    Allergies Patient has no known allergies.  Family History  Problem Relation Age of Onset   Hypertension Mother    Hypertension Father     Social History Social History   Tobacco Use   Smoking status: Every Day    Packs/day: 0.25    Types: Cigarettes   Smokeless tobacco: Never  Vaping Use   Vaping Use: Never used  Substance Use Topics   Alcohol use: Yes    Alcohol/week: 12.0  standard drinks    Types: 12 Standard drinks or equivalent per week   Drug use: No    Review of Systems  Constitutional: No fever/chills Eyes: No visual changes. ENT: No sore throat. Cardiovascular: Denies chest pain. Respiratory: Denies shortness of breath. Gastrointestinal:  No nausea, no vomiting. No constipation. Positive for lower abdominal pain, hematochezia and loose  stools Genitourinary: Negative for dysuria. Musculoskeletal: Negative for back pain. Skin: Negative for rash. Neurological: Negative for headaches, focal weakness or numbness.  ____________________________________________   PHYSICAL EXAM:  VITAL SIGNS: Vitals:   05/29/21 1111 05/29/21 1331  BP: (!) 129/93 113/87  Pulse: 88 74  Resp: 17 18  Temp: 98.8 F (37.1 C)   SpO2: 98% 100%    Constitutional: Alert and oriented. Well appearing and in no acute distress. Eyes: Conjunctivae are normal. PERRL. EOMI. Head: Atraumatic. Nose: No congestion/rhinnorhea. Mouth/Throat: Mucous membranes are moist.  Oropharynx non-erythematous. Neck: No stridor. No cervical spine tenderness to palpation. Cardiovascular: Normal rate, regular rhythm. Grossly normal heart sounds.  Good peripheral circulation. Respiratory: Normal respiratory effort.  No retractions. Lungs CTAB. Gastrointestinal: Soft , nondistended,. No CVA tenderness. Minimal tenderness to palpation to bilateral lower quadrants, fairly to the LLQ.  No peritoneal features.  Upper abdomen is benign. Rectal exams deferred by the patient Musculoskeletal: No lower extremity tenderness nor edema.  No joint effusions. No signs of acute trauma. Neurologic:  Normal speech and language. No gross focal neurologic deficits are appreciated. No gait instability noted. Skin:  Skin is warm, dry and intact. No rash noted. Psychiatric: Mood and affect are normal. Speech and behavior are normal. ____________________________________________   LABS (all labs ordered are listed, but only abnormal results are displayed)  Labs Reviewed  BASIC METABOLIC PANEL - Abnormal; Notable for the following components:      Result Value   Potassium 3.0 (*)    Chloride 96 (*)    Glucose, Bld 111 (*)    Calcium 8.4 (*)    Anion gap 17 (*)    All other components within normal limits  CBC WITH DIFFERENTIAL/PLATELET    ____________________________________________  12 Lead EKG   ____________________________________________  RADIOLOGY  ED MD interpretation:    Official radiology report(s): No results found.  ____________________________________________   PROCEDURES and INTERVENTIONS  Procedure(s) performed (including Critical Care):  Procedures  Medications  potassium chloride SA (KLOR-CON) CR tablet 40 mEq (40 mEq Oral Given 05/29/21 1355)    ____________________________________________   MDM / ED COURSE   40 year old female presents to the ED with stigmata of acute uncomplicated diverticulitis amenable to trial of outpatient management.  Normal vitals.  Exam is generally reassuring with some mild lower abdominal tenderness, but no peritoneal features, guarding or more concerning features.  Blood work with mild hypokalemia that was repleted orally, otherwise unremarkable.  Leukocytosis or stigmata of sepsis.  I discussed with her DRE and CT imaging, but shared decision-making yields plan not to pursue these as it would be unlikely to change management.  She has no anal pain to suggest anal fissures or external hemorrhoids.  We discussed the possibility of abscess and perforation, though she has no significant signs to indicate this, so CT was declined.  We discussed following up with GI as an outpatient and return precautions for the ED.  Patient suitable for outpatient management.  Clinical Course as of 05/29/21 1558  Tue May 29, 2021  1415 Shared decision making at the bedside.  We discussed likely uncomplicated diverticulitis.  We discussed low suspicion  for complication such as abscess or perforation considering her benign examination and lack of leukocytosis or vital sign derangements.  We discussed digital rectal exam, but would likely not change management as she has no symptoms to suggest anal fissures or external hemorrhoids, considering her lack of anorectal pain.  We discussed  management of likely diverticulitis at home, we discussed following up with GI and return precautions for the ED.  She is in agreement. [DS]    Clinical Course User Index [DS] Delton Prairie, MD    ____________________________________________   FINAL CLINICAL IMPRESSION(S) / ED DIAGNOSES  Final diagnoses:  Lower GI bleed  Diverticulitis  Hypokalemia     ED Discharge Orders     None        Glennys Schorsch Katrinka Blazing   Note:  This document was prepared using Dragon voice recognition software and may include unintentional dictation errors.    Delton Prairie, MD 05/29/21 [phone]

## 2021-05-29 NOTE — ED Triage Notes (Signed)
X 2 DAYS BRIGHT BLOOD , WAS SENT BY PCP , NO DIZZINESS.

## 2021-05-30 ENCOUNTER — Telehealth: Payer: Self-pay

## 2021-05-30 NOTE — Telephone Encounter (Signed)
Spoke with patient about ED visit. Per Maritza's guidance, the patient has diverticulosis instead of diverticulitis. Her CBC was normal at the ED, no imaging was completed, and no antibiotics were given. During the ED assessment, no abdominal pain or tenderness was noticed. Advised the patient of these findings and she gave verbal understanding. Maritza's recommendation was to make necessary lifestyle changes (increase fiber and fluid intake per discharge instructions) and if symptoms continue to make a lab appointment for Friday to ensure WBC continues to be normal.

## 2021-06-06 ENCOUNTER — Other Ambulatory Visit: Payer: Self-pay

## 2021-06-06 ENCOUNTER — Ambulatory Visit (INDEPENDENT_AMBULATORY_CARE_PROVIDER_SITE_OTHER): Payer: PRIVATE HEALTH INSURANCE | Admitting: Nurse Practitioner

## 2021-06-06 ENCOUNTER — Encounter: Payer: Self-pay | Admitting: Nurse Practitioner

## 2021-06-06 VITALS — BP 106/65 | HR 90 | Temp 98.6°F | Ht 64.0 in | Wt 155.9 lb

## 2021-06-06 DIAGNOSIS — K591 Functional diarrhea: Secondary | ICD-10-CM

## 2021-06-06 DIAGNOSIS — F411 Generalized anxiety disorder: Secondary | ICD-10-CM

## 2021-06-06 DIAGNOSIS — K921 Melena: Secondary | ICD-10-CM

## 2021-06-06 DIAGNOSIS — R1032 Left lower quadrant pain: Secondary | ICD-10-CM

## 2021-06-06 DIAGNOSIS — E876 Hypokalemia: Secondary | ICD-10-CM

## 2021-06-06 MED ORDER — DICYCLOMINE HCL 10 MG PO CAPS: 10.0000 mg | ORAL_CAPSULE | Freq: Three times a day (TID) | ORAL | 1 refills | Status: AC | PRN

## 2021-06-06 MED ORDER — SERTRALINE HCL 50 MG PO TABS: 100.0000 mg | ORAL_TABLET | Freq: Every day | ORAL | 2 refills | Status: AC

## 2021-06-06 NOTE — Progress Notes (Signed)
Acute Office Visit  Subjective:    Patient ID: Christina Krueger, female    DOB: Nov 14, 1980, 40 y.o.   MRN: [id number]  Chief Complaint  Patient presents with   Rectal Bleeding    The patient states that she started noting blood in the stool about two weeks ago. She is having frequent episodes of diarrhea which are preempted by intestinal cramping. She states that she is having several episodes of diarrhea and intestinal cramping each day. States that sometimes there is blood present. Sometimes there is more than other times. Sometimes there is no blood at all. She states that in last two days, she has not had blood in her stool, she continues to have several episodes of diarrhea each day. Will feel bloated after eating or drinking. Cannot relate this to any food in particular.   Rectal Bleeding  The current episode started more than 2 weeks ago. The onset was sudden. The problem occurs frequently. The problem has been gradually improving. The pain is mild. The stool is described as liquid and mixed with blood. There was no prior successful therapy. There was no prior unsuccessful therapy. Associated symptoms include abdominal pain, diarrhea and headaches. Pertinent negatives include no anorexia, no fever, no hematemesis, no nausea, no rectal pain, no vomiting, no hematuria, no vaginal bleeding, no vaginal discharge, no chest pain, no coughing and no rash.    Past Medical History:  Diagnosis Date   Hypertension     Past Surgical History:  Procedure Laterality Date   TUBAL LIGATION      Family History  Problem Relation Age of Onset   Hypertension Mother    Hypertension Father     Social History   Socioeconomic History   Marital status: Married    Spouse name: Not on file   Number of children: Not on file   Years of education: Not on file   Highest education level: Not on file  Occupational History   Not on file  Tobacco Use   Smoking status: Every Day    Packs/day: 0.25     Types: Cigarettes   Smokeless tobacco: Never  Vaping Use   Vaping Use: Never used  Substance and Sexual Activity   Alcohol use: Yes    Alcohol/week: 12.0 standard drinks    Types: 12 Standard drinks or equivalent per week   Drug use: No   Sexual activity: Yes    Birth control/protection: None  Other Topics Concern   Not on file  Social History Narrative   Not on file   Social Determinants of Health   Financial Resource Strain: Not on file  Food Insecurity: Not on file  Transportation Needs: Not on file  Physical Activity: Not on file  Stress: Not on file  Social Connections: Not on file  Intimate Partner Violence: Not on file    Outpatient Medications Prior to Visit  Medication Sig Dispense Refill   amLODipine-olmesartan (AZOR) 10-40 MG tablet Take 1 tablet by mouth daily. 90 tablet 0   azelastine (ASTELIN) 0.1 % nasal spray Place 2 sprays into both nostrils 2 (two) times daily. 30 mL 1   fluticasone (FLONASE) 50 MCG/ACT nasal spray Place 2 sprays into both nostrils daily. 1 g 0   hydrochlorothiazide (MICROZIDE) 12.5 MG capsule Take 1 capsule by mouth once daily 90 capsule 0   hydrOXYzine (ATARAX/VISTARIL) 50 MG tablet Take 0.5-1 tablet by mouth three times daily as needed for severe anxiety. 60 tablet 0   naproxen (NAPROSYN) 500  MG tablet Take 1 tablet (500 mg total) by mouth 2 (two) times daily with a meal. 30 tablet 0   sertraline (ZOLOFT) 50 MG tablet Take 2 tablets (100 mg total) by mouth daily. 60 tablet 0   No facility-administered medications prior to visit.    No Known Allergies  Review of Systems  Constitutional:  Positive for fatigue. Negative for activity change, appetite change, chills and fever.  HENT:  Negative for congestion, postnasal drip, rhinorrhea, sinus pressure, sinus pain, sneezing and sore throat.   Eyes: Negative.   Respiratory:  Negative for cough, chest tightness, shortness of breath and wheezing.   Cardiovascular:  Negative for chest  pain and palpitations.  Gastrointestinal:  Positive for abdominal pain, blood in stool, diarrhea and hematochezia. Negative for anorexia, constipation, hematemesis, nausea, rectal pain and vomiting.  Endocrine: Negative for cold intolerance, heat intolerance, polydipsia and polyuria.  Genitourinary:  Negative for dyspareunia, dysuria, flank pain, frequency, hematuria, urgency, vaginal bleeding and vaginal discharge.  Musculoskeletal:  Negative for arthralgias, back pain and myalgias.  Skin:  Negative for rash.  Allergic/Immunologic: Negative for environmental allergies.  Neurological:  Positive for headaches. Negative for dizziness and weakness.  Hematological:  Negative for adenopathy.  Psychiatric/Behavioral:  Positive for dysphoric mood. The patient is not nervous/anxious.       Objective:    Physical Exam Vitals and nursing note reviewed.  Constitutional:      Appearance: Normal appearance. She is well-developed. She is ill-appearing.  HENT:     Head: Normocephalic and atraumatic.     Nose: Nose normal.  Eyes:     Pupils: Pupils are equal, round, and reactive to light.  Cardiovascular:     Rate and Rhythm: Normal rate and regular rhythm.     Pulses: Normal pulses.     Heart sounds: Normal heart sounds.  Pulmonary:     Effort: Pulmonary effort is normal.     Breath sounds: Normal breath sounds.  Abdominal:     General: Abdomen is flat. Bowel sounds are increased.     Palpations: Abdomen is soft.     Tenderness: There is abdominal tenderness in the right lower quadrant, periumbilical area and left lower quadrant. There is guarding.     Hernia: No hernia is present.  Genitourinary:    Rectum: External hemorrhoid present.  Musculoskeletal:        General: Normal range of motion.     Cervical back: Normal range of motion and neck supple.  Lymphadenopathy:     Cervical: No cervical adenopathy.  Skin:    General: Skin is warm and dry.     Capillary Refill: Capillary refill  takes less than 2 seconds.  Neurological:     General: No focal deficit present.     Mental Status: She is alert and oriented to person, place, and time.  Psychiatric:        Mood and Affect: Mood normal.        Behavior: Behavior normal.        Thought Content: Thought content normal.        Judgment: Judgment normal.    Today's Vitals   06/06/21 1311  BP: 106/65  Pulse: 90  Temp: 98.6 F (37 C)  SpO2: 98%  Weight: 155 lb 14.4 oz (70.7 kg)  Height: $Remove'5\' 4"'NcUlAnN$  (1.626 m)   Body mass index is 26.76 kg/m.   Wt Readings from Last 3 Encounters:  06/06/21 155 lb 14.4 oz (70.7 kg)  05/29/21 165 lb 5.5  oz (75 kg)  02/15/21 155 lb 9.6 oz (70.6 kg)    Health Maintenance Due  Topic Date Due   COVID-19 Vaccine (1) Never done   INFLUENZA VACCINE  Never done    There are no preventive care reminders to display for this patient.   Lab Results  Component Value Date   TSH 2.180 03/29/2021   Lab Results  Component Value Date   WBC 9.4 05/29/2021   HGB 13.3 05/29/2021   HCT 37.1 05/29/2021   MCV 90.7 05/29/2021   PLT 345 05/29/2021   Lab Results  Component Value Date   NA 135 05/29/2021   K 3.0 (L) 05/29/2021   CO2 22 05/29/2021   GLUCOSE 111 (H) 05/29/2021   BUN 8 05/29/2021   CREATININE 0.71 05/29/2021   BILITOT 0.3 03/29/2021   ALKPHOS 66 03/29/2021   AST 66 (H) 03/29/2021   ALT 47 (H) 03/29/2021   PROT 7.1 03/29/2021   ALBUMIN 4.4 03/29/2021   CALCIUM 8.4 (L) 05/29/2021   ANIONGAP 17 (H) 05/29/2021   EGFR 116 02/13/2021   Lab Results  Component Value Date   CHOL 186 03/29/2021   Lab Results  Component Value Date   HDL 47 03/29/2021   Lab Results  Component Value Date   LDLCALC 99 03/29/2021   Lab Results  Component Value Date   TRIG 234 (H) 03/29/2021   Lab Results  Component Value Date   CHOLHDL 4.0 03/29/2021   Lab Results  Component Value Date   HGBA1C 5.7 (H) 02/13/2021       Assessment & Plan:  1. Functional diarrhea Will add as needed  prescription for dicyclomine 10 mg.  Take up to 3 times daily as needed for intestinal cramping..  Advised her to increase clear liquids. The 'BRAT' diet is suggested, then progress to diet as tolerated as symptoms abate.  - dicyclomine (BENTYL) 10 MG capsule; Take 1 capsule (10 mg total) by mouth 3 (three) times daily as needed for spasms.  Dispense: 45 capsule; Refill: 1  2. Left lower quadrant abdominal pain Reviewed progress notes from ER visit 05/29/2021.  Symptoms have waxed and waned since then.  We will get CT scan of the abdomen pelvis with contrast for further evaluation.  We will base treatment on CT scan results.  Likely refer to GI for further evaluation and treatment. - CT Abdomen Pelvis W Contrast; Future  3. Blood in stool Small, noninflamed external hemorrhoid present in the rectum.  They are potentially unseen internal hemorrhoids present.  We will get repeat CBC exam CBC on 05/29/2021 was normal.  Recommended increased fiber intake as well as BRAT diet.  We will get CT scan of abdomen pelvis with contrast for further evaluation.  Will likely refer to GI for continued evaluation and treatment. - CBC  4. Hypokalemia Lab results in ER on 05/29/2021 did show hypokalemia with potassium of 3.0.  She was given 2 oral potassium tablets.  Recheck BMP today for further evaluation.  Recommend she increase clear liquids to help boost her level. - Basic Metabolic Panel (BMET)  5. GAD (generalized anxiety disorder) Generally stable.  Continue sertraline 100 mg daily.  New prescription sent to pharmacy. - sertraline (ZOLOFT) 50 MG tablet; Take 2 tablets (100 mg total) by mouth daily.  Dispense: 60 tablet; Refill: 2   Problem List Items Addressed This Visit       Digestive   Functional diarrhea - Primary   Relevant Medications   dicyclomine (BENTYL) 10  MG capsule     Other   GAD (generalized anxiety disorder)   Relevant Medications   sertraline (ZOLOFT) 50 MG tablet   Left lower  quadrant abdominal pain   Relevant Orders   CT Abdomen Pelvis W Contrast   Blood in stool   Relevant Orders   CBC   Hypokalemia   Relevant Orders   Basic Metabolic Panel (BMET)     Meds ordered this encounter  Medications   dicyclomine (BENTYL) 10 MG capsule    Sig: Take 1 capsule (10 mg total) by mouth 3 (three) times daily as needed for spasms.    Dispense:  45 capsule    Refill:  1    Order Specific Question:   Supervising Provider    Answer:   Beatrice Lecher D [[id number]]   sertraline (ZOLOFT) 50 MG tablet    Sig: Take 2 tablets (100 mg total) by mouth daily.    Dispense:  60 tablet    Refill:  2    Order Specific Question:   Supervising Provider    Answer:   Beatrice Lecher D [[id number]]   This note was dictated using Dragon Voice Recognition Software. Rapid proofreading was performed to expedite the delivery of the information. Despite proofreading, phonetic errors will occur which are common with this voice recognition software. Please take this into consideration. If there are any concerns, please contact our office.     Ronnell Freshwater, NP

## 2021-06-07 ENCOUNTER — Encounter: Payer: Self-pay | Admitting: Nurse Practitioner

## 2021-06-07 LAB — BASIC METABOLIC PANEL
BUN/Creatinine Ratio: 11 (ref 9–23)
BUN: 7 mg/dL (ref 6–20)
CO2: 23 mmol/L (ref 20–29)
Calcium: 9.2 mg/dL (ref 8.7–10.2)
Chloride: 96 mmol/L (ref 96–106)
Creatinine, Ser: 0.62 mg/dL (ref 0.57–1.00)
Glucose: 118 mg/dL — ABNORMAL HIGH (ref 65–99)
Potassium: 3.5 mmol/L (ref 3.5–5.2)
Sodium: 140 mmol/L (ref 134–144)
eGFR: 116 mL/min/{1.73_m2} (ref >59)

## 2021-06-07 LAB — CBC
Hematocrit: 38.7 % (ref 34.0–46.6)
Hemoglobin: 13 g/dL (ref 11.1–15.9)
MCH: 31.3 pg (ref 26.6–33.0)
MCHC: 33.6 g/dL (ref 31.5–35.7)
MCV: 93 fL (ref 79–97)
Platelets: 404 10*3/uL (ref 150–450)
RBC: 4.16 x10E6/uL (ref 3.77–5.28)
RDW: 14.5 % (ref 11.7–15.4)
WBC: 9.1 10*3/uL (ref 3.4–10.8)

## 2021-06-07 NOTE — Progress Notes (Signed)
Labs look good. Glucose 118, but labs were not done fasting. MyChart message sent to patient.

## 2021-06-19 ENCOUNTER — Telehealth: Payer: Self-pay | Admitting: Physician Assistant

## 2021-06-19 NOTE — Telephone Encounter (Signed)
[facility] called to see if the authorization has been obtained for the CT scheduled for Friday, June 22, 2021.  Please call [facility] with a status report.

## 2021-06-21 NOTE — Telephone Encounter (Signed)
PA has been obtained by Chrissie Noa

## 2021-06-22 ENCOUNTER — Inpatient Hospital Stay: Admission: RE | Admit: 2021-06-22 | Payer: PRIVATE HEALTH INSURANCE | Source: Ambulatory Visit

## 2021-08-20 ENCOUNTER — Ambulatory Visit: Payer: PRIVATE HEALTH INSURANCE | Admitting: Physician Assistant

## 2022-03-02 IMAGING — DX DG FOOT COMPLETE 3+V*R*
3 series · 3 of 3 positions shown · non-contrast
Comparison: None.

CLINICAL DATA: Right foot pain after tripping yesterday.

EXAM:
RIGHT FOOT COMPLETE - 3+ VIEW

[foot supine dp]
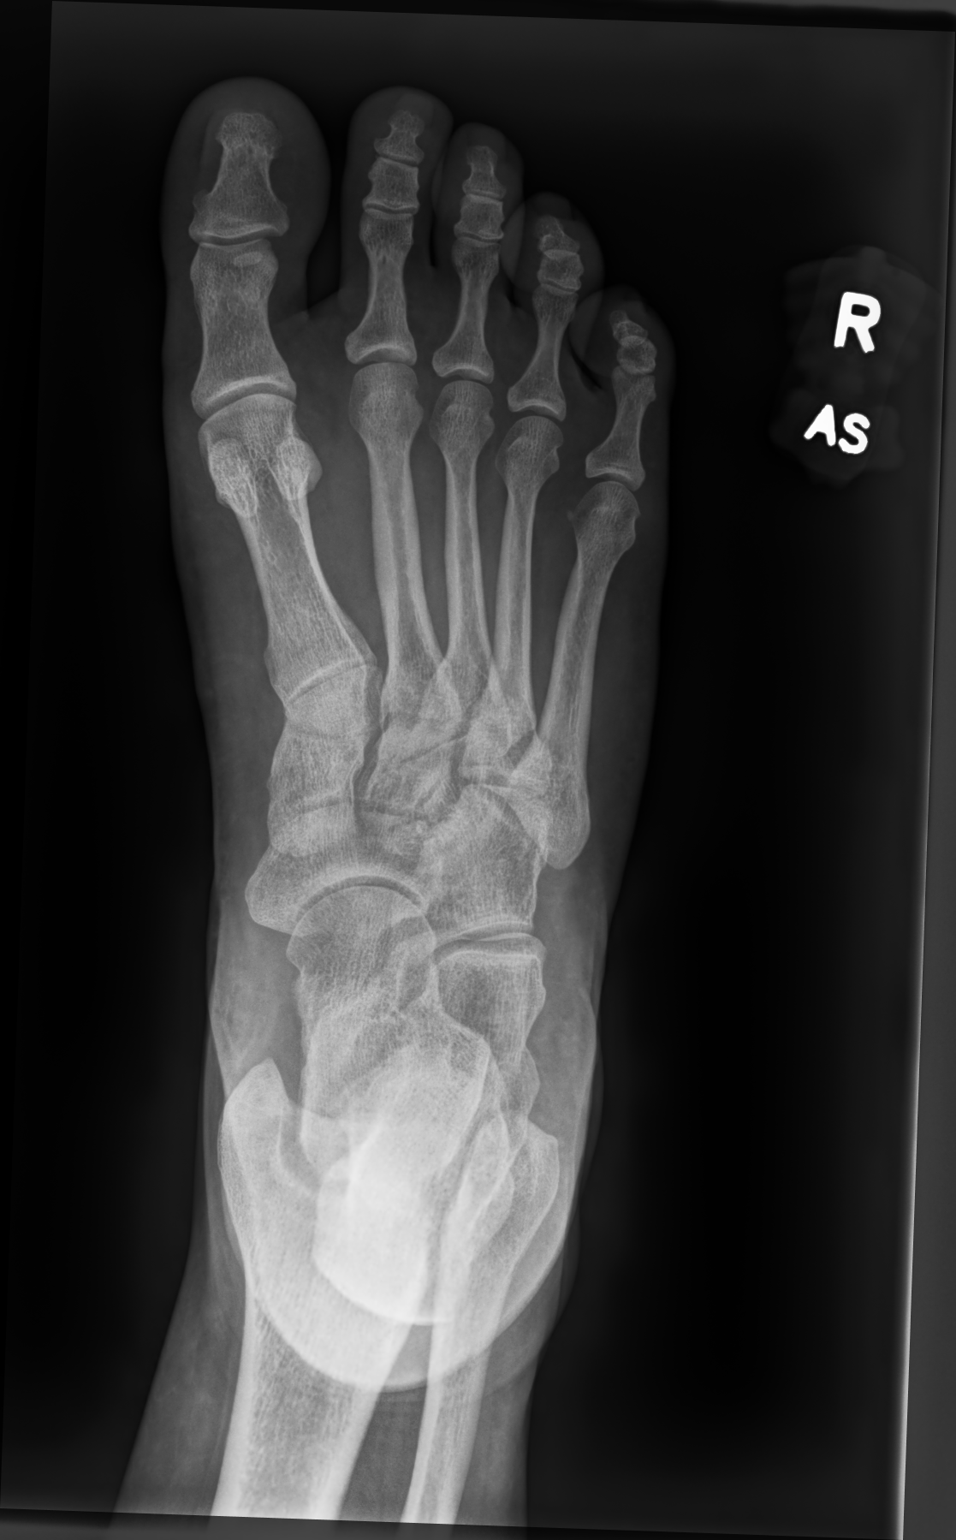

[foot medial oblique]
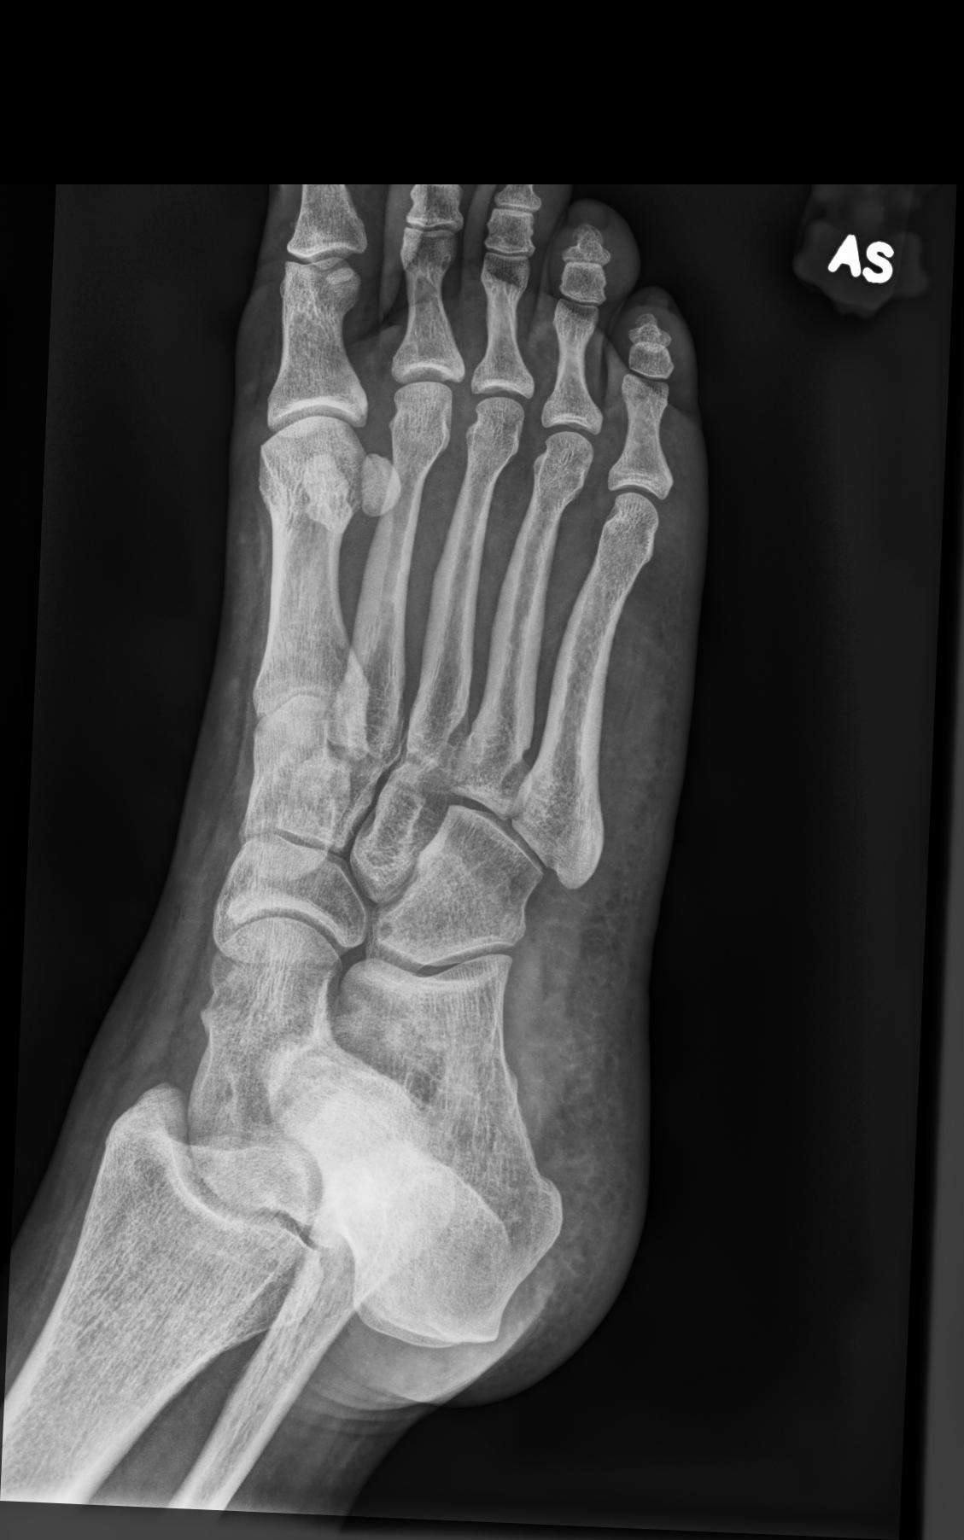

[foot supine lat]
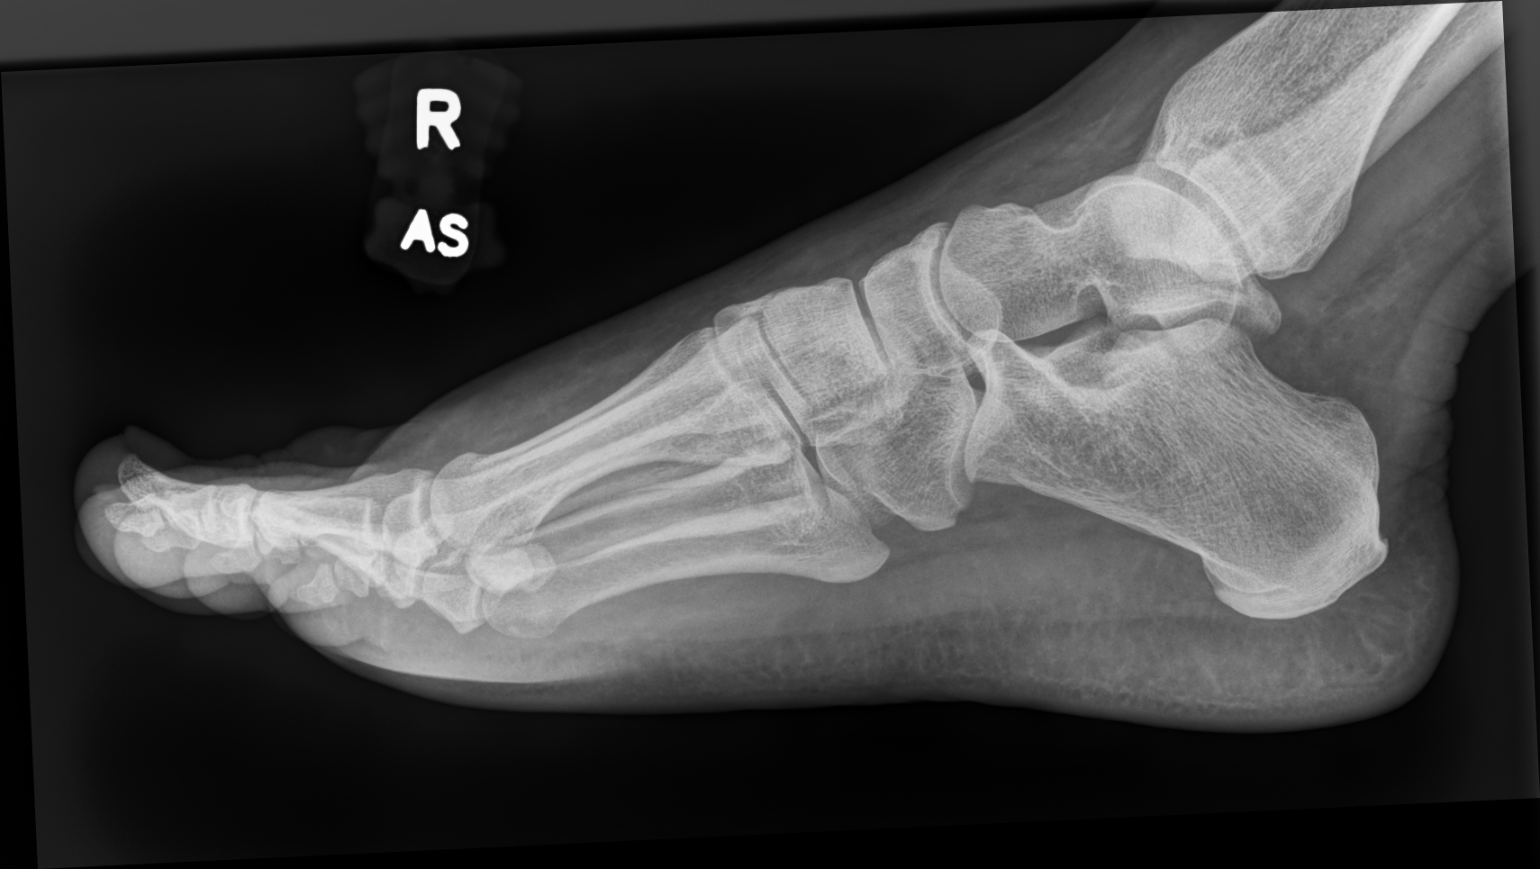

[3 of 3 positions shown; findings below may reference images not displayed]

FINDINGS: The mineralization and alignment are normal. There is no evidence of
acute fracture or dislocation. The joint spaces are preserved. There
appears to be some dorsal forefoot soft tissue swelling without
evidence of foreign body or soft tissue emphysema.
IMPRESSION: Probable dorsal soft tissue swelling. No evidence of acute fracture
or dislocation.
